# Patient Record
Sex: Female | Born: 1945
Health system: Southern US, Community
[De-identification: ages and names within clinical notes are randomized; demographics above are authoritative.]

## PROBLEM LIST (undated history)

## (undated) DIAGNOSIS — I82409 Acute embolism and thrombosis of unspecified deep veins of unspecified lower extremity: Secondary | ICD-10-CM

## (undated) DIAGNOSIS — I2510 Atherosclerotic heart disease of native coronary artery without angina pectoris: Secondary | ICD-10-CM

## (undated) DIAGNOSIS — F419 Anxiety disorder, unspecified: Secondary | ICD-10-CM

## (undated) DIAGNOSIS — C439 Malignant melanoma of skin, unspecified: Secondary | ICD-10-CM

## (undated) DIAGNOSIS — N809 Endometriosis, unspecified: Secondary | ICD-10-CM

## (undated) DIAGNOSIS — D219 Benign neoplasm of connective and other soft tissue, unspecified: Secondary | ICD-10-CM

## (undated) DIAGNOSIS — F32A Depression, unspecified: Secondary | ICD-10-CM

## (undated) DIAGNOSIS — I10 Essential (primary) hypertension: Secondary | ICD-10-CM

## (undated) DIAGNOSIS — F329 Major depressive disorder, single episode, unspecified: Secondary | ICD-10-CM

## (undated) DIAGNOSIS — E785 Hyperlipidemia, unspecified: Secondary | ICD-10-CM

## (undated) HISTORY — DX: Benign neoplasm of connective and other soft tissue, unspecified: D21.9

## (undated) HISTORY — DX: Anxiety disorder, unspecified: F41.9

## (undated) HISTORY — DX: Hyperlipidemia, unspecified: E78.5

## (undated) HISTORY — DX: Malignant melanoma of skin, unspecified: C43.9

## (undated) HISTORY — DX: Major depressive disorder, single episode, unspecified: F32.9

## (undated) HISTORY — DX: Endometriosis, unspecified: N80.9

## (undated) HISTORY — DX: Acute embolism and thrombosis of unspecified deep veins of unspecified lower extremity: I82.409

## (undated) HISTORY — DX: Depression, unspecified: F32.A

## (undated) HISTORY — DX: Essential (primary) hypertension: I10

## (undated) HISTORY — DX: Atherosclerotic heart disease of native coronary artery without angina pectoris: I25.10

---

## 1985-07-20 DIAGNOSIS — I82409 Acute embolism and thrombosis of unspecified deep veins of unspecified lower extremity: Secondary | ICD-10-CM

## 1985-07-20 HISTORY — PX: ABDOMINAL HYSTERECTOMY: SHX81

## 1985-07-20 HISTORY — DX: Acute embolism and thrombosis of unspecified deep veins of unspecified lower extremity: I82.409

## 1988-07-20 HISTORY — PX: CARDIAC CATHETERIZATION: SHX172

## 1998-06-22 ENCOUNTER — Encounter: Payer: Self-pay | Admitting: Emergency Medicine

## 1998-06-22 ENCOUNTER — Inpatient Hospital Stay (HOSPITAL_COMMUNITY): Admission: EM | Admit: 1998-06-22 | Discharge: 1998-06-25 | Payer: Self-pay | Admitting: Emergency Medicine

## 1998-09-18 ENCOUNTER — Other Ambulatory Visit: Admission: RE | Admit: 1998-09-18 | Discharge: 1998-09-18 | Payer: Self-pay | Admitting: Obstetrics and Gynecology

## 1998-10-04 ENCOUNTER — Encounter: Payer: Self-pay | Admitting: Cardiology

## 1998-10-04 ENCOUNTER — Ambulatory Visit (HOSPITAL_COMMUNITY): Admission: RE | Admit: 1998-10-04 | Discharge: 1998-10-04 | Payer: Self-pay | Admitting: Cardiology

## 2000-01-13 ENCOUNTER — Encounter: Payer: Self-pay | Admitting: Obstetrics and Gynecology

## 2000-01-13 ENCOUNTER — Encounter: Admission: RE | Admit: 2000-01-13 | Discharge: 2000-01-13 | Payer: Self-pay | Admitting: Obstetrics and Gynecology

## 2000-12-23 ENCOUNTER — Other Ambulatory Visit: Admission: RE | Admit: 2000-12-23 | Discharge: 2000-12-23 | Payer: Self-pay | Admitting: Obstetrics and Gynecology

## 2001-01-26 ENCOUNTER — Encounter: Payer: Self-pay | Admitting: Obstetrics and Gynecology

## 2001-01-26 ENCOUNTER — Ambulatory Visit (HOSPITAL_COMMUNITY): Admission: RE | Admit: 2001-01-26 | Discharge: 2001-01-26 | Payer: Self-pay | Admitting: Obstetrics and Gynecology

## 2001-05-18 ENCOUNTER — Encounter: Payer: Self-pay | Admitting: Orthopedic Surgery

## 2001-05-25 ENCOUNTER — Observation Stay (HOSPITAL_COMMUNITY): Admission: RE | Admit: 2001-05-25 | Discharge: 2001-05-26 | Payer: Self-pay | Admitting: Orthopedic Surgery

## 2002-03-27 HISTORY — PX: OTHER SURGICAL HISTORY: SHX169

## 2002-09-12 ENCOUNTER — Encounter: Admission: RE | Admit: 2002-09-12 | Discharge: 2002-09-12 | Payer: Self-pay | Admitting: Endocrinology

## 2002-09-12 ENCOUNTER — Encounter: Payer: Self-pay | Admitting: Endocrinology

## 2002-09-16 ENCOUNTER — Emergency Department (HOSPITAL_COMMUNITY): Admission: EM | Admit: 2002-09-16 | Discharge: 2002-09-16 | Payer: Self-pay | Admitting: Emergency Medicine

## 2002-09-16 ENCOUNTER — Encounter: Payer: Self-pay | Admitting: Emergency Medicine

## 2002-09-27 HISTORY — PX: OTHER SURGICAL HISTORY: SHX169

## 2002-10-19 ENCOUNTER — Encounter: Payer: Self-pay | Admitting: Neurology

## 2002-10-19 ENCOUNTER — Ambulatory Visit (HOSPITAL_COMMUNITY): Admission: RE | Admit: 2002-10-19 | Discharge: 2002-10-19 | Payer: Self-pay | Admitting: Neurology

## 2002-11-28 ENCOUNTER — Ambulatory Visit (HOSPITAL_COMMUNITY): Admission: RE | Admit: 2002-11-28 | Discharge: 2002-11-28 | Payer: Self-pay | Admitting: Obstetrics and Gynecology

## 2002-11-28 ENCOUNTER — Encounter: Payer: Self-pay | Admitting: Obstetrics and Gynecology

## 2003-07-21 HISTORY — PX: JOINT REPLACEMENT: SHX530

## 2003-10-30 ENCOUNTER — Encounter: Admission: RE | Admit: 2003-10-30 | Discharge: 2003-10-30 | Payer: Self-pay | Admitting: Endocrinology

## 2004-05-21 ENCOUNTER — Encounter: Admission: RE | Admit: 2004-05-21 | Discharge: 2004-05-21 | Payer: Self-pay | Admitting: Endocrinology

## 2004-09-18 ENCOUNTER — Encounter: Admission: RE | Admit: 2004-09-18 | Discharge: 2004-09-18 | Payer: Self-pay | Admitting: Endocrinology

## 2005-02-25 ENCOUNTER — Encounter: Admission: RE | Admit: 2005-02-25 | Discharge: 2005-02-25 | Payer: Self-pay | Admitting: Obstetrics and Gynecology

## 2006-07-01 ENCOUNTER — Encounter: Admission: RE | Admit: 2006-07-01 | Discharge: 2006-07-01 | Payer: Self-pay | Admitting: Obstetrics and Gynecology

## 2006-07-21 ENCOUNTER — Encounter: Admission: RE | Admit: 2006-07-21 | Discharge: 2006-07-21 | Payer: Self-pay | Admitting: Obstetrics and Gynecology

## 2006-08-30 ENCOUNTER — Encounter: Admission: RE | Admit: 2006-08-30 | Discharge: 2006-08-30 | Payer: Self-pay | Admitting: Endocrinology

## 2007-05-04 ENCOUNTER — Encounter: Admission: RE | Admit: 2007-05-04 | Discharge: 2007-05-04 | Payer: Self-pay | Admitting: Obstetrics and Gynecology

## 2007-08-26 ENCOUNTER — Encounter: Admission: RE | Admit: 2007-08-26 | Discharge: 2007-08-26 | Payer: Self-pay | Admitting: Obstetrics and Gynecology

## 2007-10-11 ENCOUNTER — Encounter: Admission: RE | Admit: 2007-10-11 | Discharge: 2007-10-11 | Payer: Self-pay | Admitting: Endocrinology

## 2008-07-30 ENCOUNTER — Other Ambulatory Visit: Admission: RE | Admit: 2008-07-30 | Discharge: 2008-07-30 | Payer: Self-pay | Admitting: Obstetrics & Gynecology

## 2009-10-08 ENCOUNTER — Encounter: Admission: RE | Admit: 2009-10-08 | Discharge: 2009-10-08 | Payer: Self-pay | Admitting: Obstetrics and Gynecology

## 2009-10-11 ENCOUNTER — Encounter: Admission: RE | Admit: 2009-10-11 | Discharge: 2009-10-11 | Payer: Self-pay | Admitting: Obstetrics and Gynecology

## 2010-08-10 ENCOUNTER — Encounter: Payer: Self-pay | Admitting: Obstetrics and Gynecology

## 2010-08-10 ENCOUNTER — Encounter: Payer: Self-pay | Admitting: Endocrinology

## 2010-12-05 NOTE — Op Note (Signed)
[facility]  Patient:    JAMIEKA, ROYLE Visit Number: [id number] MRN: [id number]          Service Type: SUR Location: 4W [id number] [id number] Attending Physician:  Skip Mayer Dictated by:   Georges Lynch Darrelyn Hillock, M.D. Proc. Date: 05/25/01 Admit Date:  05/25/2001 Discharge Date: 05/26/2001                             Operative Report  PREOPERATIVE DIAGNOSES: 1. Severe impingement syndrome of the left shoulder. 2. Chronic subdeltoid bursitis, left shoulder. 3. Rule out tear of the rotator cuff tendon, left shoulder.  POSTOPERATIVE DIAGNOSES: 1. Severe impingement syndrome of the left shoulder. 2. Chronic subdeltoid bursitis, left shoulder.  OPERATION:  SURGEON:  Georges Lynch. Darrelyn Hillock, M.D.  ASSISTANTDruscilla Brownie. Shela Nevin, P.A.  DESCRIPTION OF PROCEDURE:  Under general anesthesia, the patient first had a inner scalene block prior to general anesthesia.  A routine orthopedic prepping and draping of the left shoulder was carried out.  The patient had 1 g of IV Ancef.  Incision was made down over the anterior aspect of the acromion.  The incision was carried a little distally and at this time, I detached the deltoid tendon from the acromion by sharp dissection.  Following this, I noted literally that you could not get a Cobb elevator between the acromion and her shoulders because of the severe impingement.  At this time, I protected the rotator cuff with a Bennett retractor and did a partial acromionectomy and an acromioplasty.  I utilized the oscillating saw and the bur.  Once this was done we had a great deal of freedom to abduct her shoulder without any pressure on the rotator cuff.  I then excised the subdeltoid bursae to explore the cuff.  The cuff was intact.  It was a little frayed but not torn and no repair of the cuff was necessary.  I thoroughly irrigated out the area and reapproximated the deltoid tendon with #1 Vicryl.  The main part of  the wound was closed in layers in the usual fashion.  A sterile dressing was applied.  She was placed in the shoulder immobilizer. Dictated by:   Georges Lynch Darrelyn Hillock, M.D. Attending Physician:  Skip Mayer DD:  05/25/01 TD:  05/27/01 Job: [id number]

## 2011-01-12 ENCOUNTER — Other Ambulatory Visit: Payer: Self-pay | Admitting: Obstetrics and Gynecology

## 2011-01-12 DIAGNOSIS — N6459 Other signs and symptoms in breast: Secondary | ICD-10-CM

## 2011-01-19 ENCOUNTER — Ambulatory Visit
Admission: RE | Admit: 2011-01-19 | Discharge: 2011-01-19 | Disposition: A | Discharge status: Home / Self Care | Payer: BC Managed Care – PPO | Source: Ambulatory Visit | Attending: Obstetrics and Gynecology | Admitting: Obstetrics and Gynecology

## 2011-01-19 ENCOUNTER — Other Ambulatory Visit: Payer: Self-pay | Admitting: Obstetrics and Gynecology

## 2011-01-19 DIAGNOSIS — N6459 Other signs and symptoms in breast: Secondary | ICD-10-CM

## 2011-03-18 ENCOUNTER — Encounter (INDEPENDENT_AMBULATORY_CARE_PROVIDER_SITE_OTHER): Payer: Self-pay | Admitting: General Surgery

## 2011-03-18 ENCOUNTER — Ambulatory Visit (INDEPENDENT_AMBULATORY_CARE_PROVIDER_SITE_OTHER): Payer: BC Managed Care – PPO | Admitting: General Surgery

## 2011-03-18 VITALS — BP 110/70 | HR 48 | Temp 97.2°F | Ht 68.0 in | Wt 182.0 lb

## 2011-03-18 DIAGNOSIS — N63 Unspecified lump in unspecified breast: Secondary | ICD-10-CM | POA: Insufficient documentation

## 2011-03-18 NOTE — Progress Notes (Signed)
Chief Complaint  Patient presents with  . Other    3cm L breast mass    HPI Katie Collins is a 65 y.o. female.   HPI  She is referred by Dr. Tresa Res for an enlarging left breast mass in the OUQ.  She first noticed it at the end of May 2012.  She had a MMG and [place] on 01/19/11/ that did not demonstrate any abnormality in that area.  She saw Dr. Tresa Res after that and she felt that the mass was larger.  She has had a simple cyst in the left breast in the past.  No family h/o breast or ovarian cancer.  Menarche at age 68, first pregnancy at age 5.  Had a TAH/BSO in the 1980s.  Has been a HRT ever since.  Past Medical History  Diagnosis Date  . Hyperlipidemia   . Hypertension   . CAD (coronary artery disease)     Past Surgical History  Procedure Date  . Joint replacement 2005    Left rotator cuff replacement   . Cardiac catheterization 1990  . Abdominal hysterectomy 1987    Family History  Problem Relation Age of Onset  . Cancer Father     lung  . Cancer Maternal Aunt     colon  . Cancer Maternal Grandmother     colon    Social History History  Substance Use Topics  . Smoking status: Never Smoker   . Smokeless tobacco: Not on file  . Alcohol Use: Yes    No Known Allergies  Current Outpatient Prescriptions  Medication Sig Dispense Refill  . aspirin 325 MG EC tablet Take 325 mg by mouth daily.        Marland Kitchen atorvastatin (LIPITOR) 40 MG tablet Take 40 mg by mouth daily.        . clopidogrel (PLAVIX) 75 MG tablet Take 75 mg by mouth daily.        . Ergocalciferol (VITAMIN D2 PO) Take 1.25 mg by mouth daily.        Marland Kitchen escitalopram (LEXAPRO) 10 MG tablet Take 10 mg by mouth daily.        Marland Kitchen estradiol (VIVELLE-DOT) 0.075 MG/24HR Place 1 patch onto the skin 2 (two) times a week.        . ezetimibe (ZETIA) 10 MG tablet Take 10 mg by mouth daily.        . indomethacin (INDOCIN) 25 MG capsule Take 25 mg by mouth 2 (two) times daily at 10 am and 4 pm.        . metoprolol succinate  (TOPROL-XL) 25 MG 24 hr tablet Take 25 mg by mouth daily.        . valsartan (DIOVAN) 320 MG tablet Take 320 mg by mouth daily.          Review of Systems Review of Systems  Constitutional: Negative.   HENT: Negative.   Respiratory: Negative.   Cardiovascular: Negative.   Gastrointestinal: Negative.   Genitourinary: Negative.   Skin: Negative.   Endo/Heme/Allergies: Bruises/bleeds easily.    Blood pressure 110/70, pulse 48, temperature 97.2 F (36.2 C), height 5\' 8"  (1.727 m), weight 182 lb (82.555 kg).  Physical Exam Physical Exam  Constitutional: She appears well-developed and well-nourished. No distress.  Neck: Normal range of motion. Neck supple.       No cervical or supraclavicular adenopathy.  Respiratory:       Right breast- no mass, nipple discharge or suspicious skin change.  Left breast-3 cm  mass laterally beginning at areola and extending laterally; no redness; no skin changes; no nipple discharge.  Musculoskeletal: She exhibits no edema.       No axillary adenopathy.  Lymphadenopathy:    She has no cervical adenopathy.     Data Reviewed MMG, [place] reports.  Assessment    Palpable left breast mass not seen on imaging studies.    Plan    Arrange for large core needle biopsy to evaluate for malignancy.  If biopsy is benign, would recommend complete removal of mass.  Will have her stop her Plavix and Aspirin one week prior to procedure.       Json Koelzer J 03/18/2011, 12:15 PM

## 2011-03-18 NOTE — Patient Instructions (Signed)
Stop Aspirin and Plavix 10 days before needle biopsy.

## 2011-04-08 ENCOUNTER — Encounter (INDEPENDENT_AMBULATORY_CARE_PROVIDER_SITE_OTHER): Payer: Self-pay | Admitting: General Surgery

## 2011-04-08 ENCOUNTER — Ambulatory Visit (INDEPENDENT_AMBULATORY_CARE_PROVIDER_SITE_OTHER): Payer: BC Managed Care – PPO | Admitting: General Surgery

## 2011-04-08 ENCOUNTER — Other Ambulatory Visit (INDEPENDENT_AMBULATORY_CARE_PROVIDER_SITE_OTHER): Payer: Self-pay | Admitting: General Surgery

## 2011-04-08 VITALS — BP 132/86 | HR 64 | Temp 97.2°F | Resp 16 | Ht 68.0 in | Wt 183.8 lb

## 2011-04-08 DIAGNOSIS — N6320 Unspecified lump in the left breast, unspecified quadrant: Secondary | ICD-10-CM

## 2011-04-08 DIAGNOSIS — N63 Unspecified lump in unspecified breast: Secondary | ICD-10-CM

## 2011-04-08 NOTE — Progress Notes (Signed)
Katie Collins is here for her large core needle biopsy of a left breast mass.  She has been off her Plavix and aspirin for one week.  The central area of the left breast was sterilely prepped and draped. The mass at the 3:00 position was palpable. The area was anesthetized with 1% Xylocaine. A small incision was made in the skin overlying the mass. Using a 16-gauge core, 3 biopsies were performed. Adequate tissue was obtained. This was sent to pathology. Hemostasis was obtained with direct pressure. Benzoin and Steri-Strips were applied to the wound followed by bulky dressing.  Will call her with results when we receive them. If the lesion is benign I still think he needs to be removed with an outpatient procedure. She tolerated this procedure well. She was given postprocedure instructions.

## 2011-04-08 NOTE — Patient Instructions (Signed)
Ice pack to area today.  May remove bandage tomorrow.  May get area wet in 2 days.  Let steri-strips fall off by themselves.  Call for heavy bleeding.

## 2011-04-09 ENCOUNTER — Telehealth (INDEPENDENT_AMBULATORY_CARE_PROVIDER_SITE_OTHER): Payer: Self-pay | Admitting: General Surgery

## 2011-04-09 NOTE — Telephone Encounter (Signed)
I discussed the results of left breast large core needle biopsy with Mrs. Katie Collins.  This demonstrated benign fibrosis.  There is a small chance however that this could be a reaction to malignancy. The plan is to go and do an excision of this mass and she agreed with that. This is what we had discussed before as well. We will go ahead and get that scheduled.

## 2011-04-20 HISTORY — PX: BREAST SURGERY: SHX581

## 2011-04-22 ENCOUNTER — Other Ambulatory Visit (INDEPENDENT_AMBULATORY_CARE_PROVIDER_SITE_OTHER): Payer: Self-pay | Admitting: General Surgery

## 2011-04-22 ENCOUNTER — Encounter (HOSPITAL_COMMUNITY): Payer: BC Managed Care – PPO

## 2011-04-22 ENCOUNTER — Ambulatory Visit (HOSPITAL_COMMUNITY)
Admission: RE | Admit: 2011-04-22 | Discharge: 2011-04-22 | Disposition: A | Discharge status: Home / Self Care | Payer: BC Managed Care – PPO | Source: Ambulatory Visit | Attending: General Surgery | Admitting: General Surgery

## 2011-04-22 DIAGNOSIS — N63 Unspecified lump in unspecified breast: Secondary | ICD-10-CM | POA: Insufficient documentation

## 2011-04-22 DIAGNOSIS — N6310 Unspecified lump in the right breast, unspecified quadrant: Secondary | ICD-10-CM

## 2011-04-22 DIAGNOSIS — Z01810 Encounter for preprocedural cardiovascular examination: Secondary | ICD-10-CM | POA: Insufficient documentation

## 2011-04-22 DIAGNOSIS — Z01818 Encounter for other preprocedural examination: Secondary | ICD-10-CM | POA: Insufficient documentation

## 2011-04-22 DIAGNOSIS — Z01812 Encounter for preprocedural laboratory examination: Secondary | ICD-10-CM | POA: Insufficient documentation

## 2011-04-22 LAB — COMPREHENSIVE METABOLIC PANEL
ALT: 16 U/L (ref 0–35)
AST: 16 U/L (ref 0–37)
Albumin: 3.7 g/dL (ref 3.5–5.2)
Alkaline Phosphatase: 35 U/L — ABNORMAL LOW (ref 39–117)
BUN: 12 mg/dL (ref 6–23)
CO2: 31 mEq/L (ref 19–32)
Calcium: 9 mg/dL (ref 8.4–10.5)
Chloride: 102 mEq/L (ref 96–112)
Creatinine, Ser: 0.6 mg/dL (ref 0.50–1.10)
GFR calc Af Amer: >90 mL/min (ref >90)
GFR calc non Af Amer: >90 mL/min (ref >90)
Glucose, Bld: 86 mg/dL (ref 70–99)
Potassium: 4.1 mEq/L (ref 3.5–5.1)
Sodium: 138 mEq/L (ref 135–145)
Total Bilirubin: 0.6 mg/dL (ref 0.3–1.2)
Total Protein: 6.7 g/dL (ref 6.0–8.3)

## 2011-04-22 LAB — CBC
HCT: 40.6 % (ref 36.0–46.0)
Hemoglobin: 13.9 g/dL (ref 12.0–15.0)
MCH: 30.9 pg (ref 26.0–34.0)
MCHC: 34.2 g/dL (ref 30.0–36.0)
MCV: 90.2 fL (ref 78.0–100.0)
Platelets: 281 10*3/uL (ref 150–400)
RBC: 4.5 MIL/uL (ref 3.87–5.11)
RDW: 12.2 % (ref 11.5–15.5)
WBC: 4.1 10*3/uL (ref 4.0–10.5)

## 2011-04-22 LAB — DIFFERENTIAL
Basophils Absolute: 0.1 10*3/uL (ref 0.0–0.1)
Basophils Relative: 2 % — ABNORMAL HIGH (ref 0–1)
Eosinophils Absolute: 0.1 10*3/uL (ref 0.0–0.7)
Eosinophils Relative: 3 % (ref 0–5)
Lymphocytes Relative: 22 % (ref 12–46)
Lymphs Abs: 0.9 10*3/uL (ref 0.7–4.0)
Monocytes Absolute: 0.4 10*3/uL (ref 0.1–1.0)
Monocytes Relative: 10 % (ref 3–12)
Neutro Abs: 2.6 10*3/uL (ref 1.7–7.7)
Neutrophils Relative %: 64 % (ref 43–77)

## 2011-04-22 LAB — SURGICAL PCR SCREEN
MRSA, PCR: NEGATIVE
Staphylococcus aureus: NEGATIVE

## 2011-04-28 ENCOUNTER — Other Ambulatory Visit (INDEPENDENT_AMBULATORY_CARE_PROVIDER_SITE_OTHER): Payer: Self-pay | Admitting: General Surgery

## 2011-04-28 ENCOUNTER — Ambulatory Visit (HOSPITAL_COMMUNITY)
Admission: RE | Admit: 2011-04-28 | Discharge: 2011-04-28 | Disposition: A | Discharge status: Home / Self Care | Payer: BC Managed Care – PPO | Source: Ambulatory Visit | Attending: General Surgery | Admitting: General Surgery

## 2011-04-28 DIAGNOSIS — Z79899 Other long term (current) drug therapy: Secondary | ICD-10-CM | POA: Insufficient documentation

## 2011-04-28 DIAGNOSIS — D249 Benign neoplasm of unspecified breast: Secondary | ICD-10-CM

## 2011-04-28 DIAGNOSIS — I10 Essential (primary) hypertension: Secondary | ICD-10-CM | POA: Insufficient documentation

## 2011-04-28 DIAGNOSIS — N63 Unspecified lump in unspecified breast: Secondary | ICD-10-CM | POA: Insufficient documentation

## 2011-04-28 DIAGNOSIS — Z7982 Long term (current) use of aspirin: Secondary | ICD-10-CM | POA: Insufficient documentation

## 2011-04-28 DIAGNOSIS — E785 Hyperlipidemia, unspecified: Secondary | ICD-10-CM | POA: Insufficient documentation

## 2011-04-30 NOTE — Op Note (Signed)
  NAMEOLUWATAMILORE, STARNES              ACCOUNT NO.:  [id number]  MEDICAL RECORD NO.:  [id number]  LOCATION:  DAYL                         FACILITY:  [facility]  PHYSICIAN:  Adolph Pollack, M.D.DATE OF BIRTH:  Feb 03, 1946  DATE OF PROCEDURE:  04/28/2011 DATE OF DISCHARGE:  04/28/2011                              OPERATIVE REPORT   PREOPERATIVE DIAGNOSIS:  Persistent left breast mass.  POSTOPERATIVE DIAGNOSIS:  Persistent left breast mass.  PROCEDURE:  Excision of left breast mass.  SURGEON:  Adolph Pollack, M.D.  ANESTHESIA:  General/LMA plus Marcaine local.  INDICATION:  Mrs. Ramdass is a 65 year old female who was found to have an enlarging left breast mass in the upper outer quadrant.  Imaging studies did not definitively demonstrate this.  She subsequently underwent a large core needle biopsy which demonstrated breast fibrosis; however, the mass persisted and given the pathology which could be associated with underlying malignancy, it was recommended that she have excision, now she presents for that.  Procedure and risks and aftercare were discussed with her preoperatively.  TECHNIQUE:  She was seen in the holding area and left breast marked with my initials.  She was then brought to the operating room, placed supine on the operating table, and general anesthesia was administered.  The left breast was sterilely prepped and draped.  I made a circumareolar incision from approximately the 12 o'clock position down to the 5 o'clock position, through the skin and dermis. In the subcutaneous tissues, I was able to palpate the mass.  I raised the flaps in all directions around the mass elevated with Allis forceps. Using electrocautery, I then excised the mass and surrounding breast tissue.  I marked the anterior part of the mass with a single suture and the medial part with a double suture and sent it to Pathology.  Following this, I controlled bleeding with electrocautery.  I  then injected 0.5% plain Marcaine solution for local anesthetic effect into the wound.  Second inspection demonstrated hemostasis was adequate.  Subcutaneous tissue was then approximated using 3-0 Vicryl suture.  The skin was closed with a running 4-0 Monocryl subcuticular stitch.  Steri- Strips and sterile dressings were applied.  A breast binder was applied.  She tolerated the procedure without any apparent complications and was taken to recovery room in satisfactory condition.     Adolph Pollack, M.D.     Kari Baars  D:  04/28/2011  T:  04/28/2011  Job:  [id number]  cc:   Aram Beecham P. Romine, M.D. Fax: [phone]  Brooke Bonito, M.D. Fax: [phone]  Electronically Signed by Avel Peace M.D. on 04/30/2011 12:10:06 PM

## 2011-05-04 ENCOUNTER — Telehealth (INDEPENDENT_AMBULATORY_CARE_PROVIDER_SITE_OTHER): Payer: Self-pay

## 2011-05-04 NOTE — Telephone Encounter (Signed)
Patient called wanting pathology report results. I read the results to her and she understood them. Will follow up with [place] at next appointment.

## 2011-05-14 ENCOUNTER — Encounter (INDEPENDENT_AMBULATORY_CARE_PROVIDER_SITE_OTHER): Payer: Self-pay

## 2011-05-14 ENCOUNTER — Ambulatory Visit (INDEPENDENT_AMBULATORY_CARE_PROVIDER_SITE_OTHER): Payer: BC Managed Care – PPO | Admitting: General Surgery

## 2011-05-14 ENCOUNTER — Encounter (INDEPENDENT_AMBULATORY_CARE_PROVIDER_SITE_OTHER): Payer: Self-pay | Admitting: General Surgery

## 2011-05-14 VITALS — BP 128/82 | HR 68 | Temp 97.4°F | Resp 16 | Ht 68.0 in | Wt 182.8 lb

## 2011-05-14 DIAGNOSIS — N6459 Other signs and symptoms in breast: Secondary | ICD-10-CM

## 2011-05-14 DIAGNOSIS — N6019 Diffuse cystic mastopathy of unspecified breast: Secondary | ICD-10-CM | POA: Insufficient documentation

## 2011-05-14 NOTE — Progress Notes (Signed)
Katie Collins is here for a postop visit following excision of a left breast mass on 04/28/11.  She is doing well with no pain or wound problems.    Exam:  Left breast incision is clean and intact with some surrounding ecchymosis.  Pathology: Benign fibrocystic change, no malignancy.  Assess:  Left breast wound healing well and pathology is benign.  Plan:  Return visit prn.

## 2011-07-21 HISTORY — PX: BREAST EXCISIONAL BIOPSY: SUR124

## 2012-04-05 DIAGNOSIS — S82009A Unspecified fracture of unspecified patella, initial encounter for closed fracture: Secondary | ICD-10-CM | POA: Diagnosis not present

## 2012-04-12 DIAGNOSIS — S82009A Unspecified fracture of unspecified patella, initial encounter for closed fracture: Secondary | ICD-10-CM | POA: Diagnosis not present

## 2012-04-19 HISTORY — PX: PATELLA FRACTURE SURGERY: SHX735

## 2012-04-21 ENCOUNTER — Other Ambulatory Visit: Payer: Self-pay | Admitting: Orthopedic Surgery

## 2012-04-21 ENCOUNTER — Ambulatory Visit (HOSPITAL_COMMUNITY)
Admission: RE | Admit: 2012-04-21 | Discharge: 2012-04-21 | Disposition: A | Discharge status: Home / Self Care | Payer: Medicare Other | Source: Ambulatory Visit | Attending: Orthopedic Surgery | Admitting: Orthopedic Surgery

## 2012-04-21 DIAGNOSIS — M7989 Other specified soft tissue disorders: Secondary | ICD-10-CM

## 2012-04-21 DIAGNOSIS — M79606 Pain in leg, unspecified: Secondary | ICD-10-CM

## 2012-04-21 DIAGNOSIS — I82891 Chronic embolism and thrombosis of other specified veins: Secondary | ICD-10-CM | POA: Insufficient documentation

## 2012-04-21 DIAGNOSIS — M79609 Pain in unspecified limb: Secondary | ICD-10-CM | POA: Diagnosis not present

## 2012-04-21 DIAGNOSIS — I82890 Acute embolism and thrombosis of other specified veins: Secondary | ICD-10-CM | POA: Insufficient documentation

## 2012-04-21 DIAGNOSIS — S82009A Unspecified fracture of unspecified patella, initial encounter for closed fracture: Secondary | ICD-10-CM | POA: Diagnosis not present

## 2012-04-21 HISTORY — PX: OTHER SURGICAL HISTORY: SHX169

## 2012-04-21 NOTE — Progress Notes (Signed)
VASCULAR LAB PRELIMINARY  PRELIMINARY  PRELIMINARY  PRELIMINARY  Left lower extremity venous duplex completed.    Preliminary report:  Left:  Acute DVT noted in the soleal vein.  Chronic DVT noted in the CFV.  No evidence of superficial thrombosis.  No Baker's cyst.  Ethelle Ola, RVT 04/21/2012, 5:14 PM

## 2012-04-22 DIAGNOSIS — E789 Disorder of lipoprotein metabolism, unspecified: Secondary | ICD-10-CM | POA: Diagnosis not present

## 2012-04-22 DIAGNOSIS — M79609 Pain in unspecified limb: Secondary | ICD-10-CM | POA: Diagnosis not present

## 2012-04-22 DIAGNOSIS — I809 Phlebitis and thrombophlebitis of unspecified site: Secondary | ICD-10-CM | POA: Diagnosis not present

## 2012-04-22 DIAGNOSIS — I10 Essential (primary) hypertension: Secondary | ICD-10-CM | POA: Diagnosis not present

## 2012-04-25 DIAGNOSIS — I809 Phlebitis and thrombophlebitis of unspecified site: Secondary | ICD-10-CM | POA: Diagnosis not present

## 2012-04-27 DIAGNOSIS — Z23 Encounter for immunization: Secondary | ICD-10-CM | POA: Diagnosis not present

## 2012-04-27 DIAGNOSIS — I2510 Atherosclerotic heart disease of native coronary artery without angina pectoris: Secondary | ICD-10-CM | POA: Diagnosis not present

## 2012-04-27 DIAGNOSIS — I809 Phlebitis and thrombophlebitis of unspecified site: Secondary | ICD-10-CM | POA: Diagnosis not present

## 2012-05-10 DIAGNOSIS — S82009A Unspecified fracture of unspecified patella, initial encounter for closed fracture: Secondary | ICD-10-CM | POA: Diagnosis not present

## 2012-05-11 DIAGNOSIS — I809 Phlebitis and thrombophlebitis of unspecified site: Secondary | ICD-10-CM | POA: Diagnosis not present

## 2012-05-11 DIAGNOSIS — I2510 Atherosclerotic heart disease of native coronary artery without angina pectoris: Secondary | ICD-10-CM | POA: Diagnosis not present

## 2012-05-11 DIAGNOSIS — I10 Essential (primary) hypertension: Secondary | ICD-10-CM | POA: Diagnosis not present

## 2012-05-11 DIAGNOSIS — E789 Disorder of lipoprotein metabolism, unspecified: Secondary | ICD-10-CM | POA: Diagnosis not present

## 2012-05-12 ENCOUNTER — Other Ambulatory Visit: Payer: Self-pay | Admitting: Obstetrics and Gynecology

## 2012-05-12 DIAGNOSIS — Z1231 Encounter for screening mammogram for malignant neoplasm of breast: Secondary | ICD-10-CM

## 2012-05-24 DIAGNOSIS — Z90710 Acquired absence of both cervix and uterus: Secondary | ICD-10-CM | POA: Diagnosis not present

## 2012-05-24 DIAGNOSIS — Z124 Encounter for screening for malignant neoplasm of cervix: Secondary | ICD-10-CM | POA: Diagnosis not present

## 2012-05-24 DIAGNOSIS — Z01419 Encounter for gynecological examination (general) (routine) without abnormal findings: Secondary | ICD-10-CM | POA: Diagnosis not present

## 2012-05-24 DIAGNOSIS — E559 Vitamin D deficiency, unspecified: Secondary | ICD-10-CM | POA: Diagnosis not present

## 2012-05-24 DIAGNOSIS — Z1272 Encounter for screening for malignant neoplasm of vagina: Secondary | ICD-10-CM | POA: Diagnosis not present

## 2012-05-24 DIAGNOSIS — Z0000 Encounter for general adult medical examination without abnormal findings: Secondary | ICD-10-CM | POA: Diagnosis not present

## 2012-06-03 ENCOUNTER — Ambulatory Visit
Admission: RE | Admit: 2012-06-03 | Discharge: 2012-06-03 | Disposition: A | Discharge status: Home / Self Care | Payer: Medicare Other | Source: Ambulatory Visit | Attending: Obstetrics and Gynecology | Admitting: Obstetrics and Gynecology

## 2012-06-03 DIAGNOSIS — Z1231 Encounter for screening mammogram for malignant neoplasm of breast: Secondary | ICD-10-CM

## 2012-06-06 DIAGNOSIS — E782 Mixed hyperlipidemia: Secondary | ICD-10-CM | POA: Diagnosis not present

## 2012-06-06 DIAGNOSIS — I10 Essential (primary) hypertension: Secondary | ICD-10-CM | POA: Diagnosis not present

## 2012-06-06 DIAGNOSIS — I82409 Acute embolism and thrombosis of unspecified deep veins of unspecified lower extremity: Secondary | ICD-10-CM | POA: Diagnosis not present

## 2012-06-06 DIAGNOSIS — I2510 Atherosclerotic heart disease of native coronary artery without angina pectoris: Secondary | ICD-10-CM | POA: Diagnosis not present

## 2012-08-04 DIAGNOSIS — I10 Essential (primary) hypertension: Secondary | ICD-10-CM | POA: Diagnosis not present

## 2012-08-23 DIAGNOSIS — I809 Phlebitis and thrombophlebitis of unspecified site: Secondary | ICD-10-CM | POA: Diagnosis not present

## 2012-08-23 DIAGNOSIS — E789 Disorder of lipoprotein metabolism, unspecified: Secondary | ICD-10-CM | POA: Diagnosis not present

## 2012-08-23 DIAGNOSIS — M79609 Pain in unspecified limb: Secondary | ICD-10-CM | POA: Diagnosis not present

## 2012-08-23 DIAGNOSIS — I10 Essential (primary) hypertension: Secondary | ICD-10-CM | POA: Diagnosis not present

## 2012-08-30 DIAGNOSIS — S82009A Unspecified fracture of unspecified patella, initial encounter for closed fracture: Secondary | ICD-10-CM | POA: Diagnosis not present

## 2012-09-16 DIAGNOSIS — M1710 Unilateral primary osteoarthritis, unspecified knee: Secondary | ICD-10-CM | POA: Diagnosis not present

## 2012-09-16 DIAGNOSIS — IMO0002 Reserved for concepts with insufficient information to code with codable children: Secondary | ICD-10-CM | POA: Diagnosis not present

## 2012-12-01 ENCOUNTER — Other Ambulatory Visit: Payer: Self-pay | Admitting: Internal Medicine

## 2012-12-01 DIAGNOSIS — R0602 Shortness of breath: Secondary | ICD-10-CM | POA: Diagnosis not present

## 2012-12-01 DIAGNOSIS — Z79899 Other long term (current) drug therapy: Secondary | ICD-10-CM | POA: Diagnosis not present

## 2012-12-01 LAB — BASIC METABOLIC PANEL
BUN: 11 mg/dL (ref 6–23)
CO2: 27 mEq/L (ref 19–32)
Calcium: 9.4 mg/dL (ref 8.4–10.5)
Chloride: 104 mEq/L (ref 96–112)
Creat: 0.83 mg/dL (ref 0.50–1.10)
Glucose, Bld: 132 mg/dL — ABNORMAL HIGH (ref 70–99)
Potassium: 3.8 mEq/L (ref 3.5–5.3)
Sodium: 140 mEq/L (ref 135–145)

## 2012-12-01 LAB — D-DIMER, QUANTITATIVE: D-Dimer, Quant: 0.27 ug/mL-FEU (ref 0.00–0.48)

## 2012-12-06 ENCOUNTER — Encounter: Payer: Self-pay | Admitting: *Deleted

## 2012-12-07 ENCOUNTER — Encounter: Payer: Self-pay | Admitting: Internal Medicine

## 2012-12-08 ENCOUNTER — Encounter: Payer: Self-pay | Admitting: Internal Medicine

## 2012-12-08 ENCOUNTER — Ambulatory Visit (INDEPENDENT_AMBULATORY_CARE_PROVIDER_SITE_OTHER): Payer: Medicare Other | Admitting: Internal Medicine

## 2012-12-08 DIAGNOSIS — E785 Hyperlipidemia, unspecified: Secondary | ICD-10-CM

## 2012-12-08 DIAGNOSIS — F32A Depression, unspecified: Secondary | ICD-10-CM | POA: Insufficient documentation

## 2012-12-08 DIAGNOSIS — T148XXA Other injury of unspecified body region, initial encounter: Secondary | ICD-10-CM | POA: Insufficient documentation

## 2012-12-08 DIAGNOSIS — I10 Essential (primary) hypertension: Secondary | ICD-10-CM

## 2012-12-08 DIAGNOSIS — F3289 Other specified depressive episodes: Secondary | ICD-10-CM | POA: Diagnosis not present

## 2012-12-08 DIAGNOSIS — F329 Major depressive disorder, single episode, unspecified: Secondary | ICD-10-CM

## 2012-12-08 DIAGNOSIS — I82409 Acute embolism and thrombosis of unspecified deep veins of unspecified lower extremity: Secondary | ICD-10-CM | POA: Diagnosis not present

## 2012-12-08 DIAGNOSIS — E782 Mixed hyperlipidemia: Secondary | ICD-10-CM | POA: Insufficient documentation

## 2012-12-08 NOTE — Patient Instructions (Addendum)
Your physician wants you to follow-up in: 12 months. You will receive a reminder letter in the mail two months in advance. If you don't receive a letter, please call our office to schedule the follow-up appointment.  Your physician has recommended you make the following change in your medication stop  Xarelto follow Dr. Rennis Golden instruction. Restart Aspirin 325 mg tablet a day   Call when you have the information about your mother's condition if you need to schedule for test

## 2012-12-08 NOTE — Progress Notes (Signed)
OFFICE NOTE  Chief Complaint:  Followup DVT  Primary Care Physician: Michiel Sites, MD  HPI:  Katie Collins is a 67 year old female formerly followed by Dr. Aleen Campi for coronary disease. She had percutaneous intervention without a stent in 1999 and had a cath in 2003 which showed no significant stenoses. She has been maintained on aspirin and Plavix up until recently when she had a fall and developed a left leg fracture, I believe it was her patella. She seemed to recover from that with splinting, however, then developed swelling and redness of her left lower extremity and was found to have a soleal vein DVT, and this was at the end of September; she was started on Xarelto October 4th and her aspirin and Plavix were held. She is sent to me today for anticoagulation management given her history of coronary disease, also to reestablish with cardiology. Overall since she started on the Xarelto she has been doing much better. There is decreased swelling, although it is persistent in the left lower extremity. Her pain has improved, and she has had no adverse bleeding issues with the Xarelto other than easy bruising. Interestingly she reported a history of clot secondary to hysterectomy in 1987 and therefore may be hypercoagulable. I am not clear as to whether she has had a workup for hypercoagulability or not in the past. She returns for a six-month visit today he reports doing well. She is intermittently been wearing a stocking to help prevent post-thrombotic syndrome and has noted a marked decrease in swelling in her left leg. Her only other concern today is that her mother was diagnosed with an aortic aneurysm, however she is in her 32. Her cardiovascular specialist recommended that the entire family be screened for aortopathy's, suggesting that there is a possible genetic component to this.  Finally over the past 2 weeks she has noted some increasing night sweats, difficulty sleeping, and  increased fatigue. She reports that she just "does not feel right".  PMHx:  Past Medical History  Diagnosis Date  . Hyperlipidemia   . Hypertension   . CAD (coronary artery disease)   . Deep vein thrombosis (DVT) 1987    REMOTE H/O; PT ALSO HAD 03/2012 A SOLEAL VEIN DVT LEFT LEG DUE TO FALL; WAS THEN STARTED ON XARELTO 04/22/12  . Depression     Past Surgical History  Procedure Laterality Date  . Joint replacement  2005    Left rotator cuff replacement   . Abdominal hysterectomy  1987  . Breast surgery  04/2011    mass in left breast  . Myocardial perfusion  03/27/02    NEGATIVE ADEQUATE BRUCE PROTOCOL W/DECONDITIONED EXERCISE RESPONSE, NORMAL STATIC AND DYNAMIC MYOCARDIAL PERFUSION IMAGES W/EF QGS 60%; LOW RSIK STUDY  . Cardiac catheterization  1990    PT HAD ANOTHER CATH 04/06/02; NORMAL-APPREARING CORONARY ARTERIES; NORMAL LV FUNCTION; NORMAL MITRAL AND AORTIC VALVES; NORMAL ABDOMINAL AORTA  AND RENAL ARTERIES  . Lower extremity venous duplex  04/21/12    FINDINGS CONSISTENT W/ACUTE DVT INVOLVING THE LEFT SOLEAL VEIN; ALSO LEFT COMMON FEMORAL VEIN  . Carotid doppler  09/27/02    NORMAL COMMON AND INTERNAL CAROTID ARTERIES BILATERALLY W/NORMAL FLOW SEEN IN BOTH INTERNAL CAROTID ARTERIE. ; ANTEGRADE FLOW IN BOTH VERTEBRAL ARTERIES    FAMHx:  Family History  Problem Relation Age of Onset  . Cancer Father     lung; PASSED AWAY 1994-08-03  . Hyperlipidemia Father   . Hypertension Father   . Heart disease Father 3  .  Cancer Maternal Aunt     colon  . Cancer Maternal Grandmother     colon    SOCHx:   reports that she has never smoked. She has never used smokeless tobacco. She reports that  drinks alcohol. She reports that she does not use illicit drugs.  ALLERGIES:  No Known Allergies  ROS: A comprehensive review of systems was negative except for: Constitutional: positive for fatigue and night sweats Cardiovascular: positive for lower extremity edema  HOME MEDS: Current  Outpatient Prescriptions  Medication Sig Dispense Refill  . aspirin 325 MG EC tablet Take 325 mg by mouth daily.        Marland Kitchen atorvastatin (LIPITOR) 40 MG tablet Take 40 mg by mouth daily.        . clopidogrel (PLAVIX) 75 MG tablet Take 75 mg by mouth daily.        . Ergocalciferol (VITAMIN D2 PO) Take 1.25 mg by mouth daily.        Marland Kitchen escitalopram (LEXAPRO) 10 MG tablet Take 10 mg by mouth daily.        Marland Kitchen ezetimibe (ZETIA) 10 MG tablet Take 10 mg by mouth daily.        . metoprolol succinate (TOPROL-XL) 25 MG 24 hr tablet Take 25 mg by mouth daily.        . valsartan (DIOVAN) 320 MG tablet Take 320 mg by mouth daily.        Carlena Hurl 20 MG TABS Take a tablet daily       No current facility-administered medications for this visit.    LABS/IMAGING: No results found for this or any previous visit (from the past 48 hour(s)). No results found.  VITALS: BP 160/100  Pulse 73  Ht 5\' 8"  (1.727 m)  Wt 190 lb 12.8 oz (86.546 kg)  BMI 29.02 kg/m2  EXAM: General appearance: alert and no distress Neck: no adenopathy, no carotid bruit, no JVD, supple, symmetrical, trachea midline and thyroid not enlarged, symmetric, no tenderness/mass/nodules Lungs: clear to auscultation bilaterally Heart: regular rate and rhythm, S1, S2 normal, no murmur, click, rub or gallop Abdomen: soft, non-tender; bowel sounds normal; no masses,  no organomegaly Extremities: extremities normal, atraumatic, no cyanosis or edema Pulses: 2+ and symmetric Skin: Warm, moist Neurologic: Grossly normal  EKG: Normal sinus rhythm with marked sinus arrhythmia at 73  ASSESSMENT: 1. Left soleal deep vein thrombosis-resolved 2. Hypertension-uncontrolled 3. Dyslipidemia 4. History of coronary artery disease status post plain old balloon angioplasty in 1999 5. Depression 6. Fatigue and night sweats, without weight loss  PLAN: 1.   Ms. Forcier seems to be improving from her left soleal DVT. She has been on [place] for greater than  6 months and has no problems with pain or swelling of the left lower leg. Her repeat D. dimer is nearly negative at 0.27, this is within the range of normal for the test. At this point I would recommend discontinuing Xarelto by tapering to 10 mg daily for 3 days with an overlap of full dose aspirin. Thereafter she should continue aspirin 325 mg daily. I do have suspicion for possible underlying clotting disorder and she had a previous clot associated with hysterectomy in 1987.  She does not desire any testing at this time however it may be helpful to obtain some testing in the future.  I not sure what to make about her night sweats, they could be perimenopausal. There is no association with weight loss decreased appetite or change in bowel habits.  Her blood pressure is elevated today and a recheck was still slightly high. I've asked her to follow her blood pressures at home we may need to increase her metoprolol. Her EKG does show a sinus arrhythmia which is new. Finally she is concerned about possible familial aortopathy, given her mother's aortic dilatation. She is to obtain information from her mother's cardiovascular specialist, which may be helpful at obtaining scanning CT aortogram to rule out any genetic aortopathy in her. Plan 4 followup annually or sooner as necessary.  Chrystie Nose, MD, [facility] Attending Cardiologist [facility]  Amarien Carne C 12/08/2012, 10:53 AM

## 2012-12-15 DIAGNOSIS — E789 Disorder of lipoprotein metabolism, unspecified: Secondary | ICD-10-CM | POA: Diagnosis not present

## 2012-12-15 DIAGNOSIS — R61 Generalized hyperhidrosis: Secondary | ICD-10-CM | POA: Diagnosis not present

## 2012-12-15 DIAGNOSIS — I10 Essential (primary) hypertension: Secondary | ICD-10-CM | POA: Diagnosis not present

## 2012-12-15 DIAGNOSIS — R0989 Other specified symptoms and signs involving the circulatory and respiratory systems: Secondary | ICD-10-CM | POA: Diagnosis not present

## 2012-12-21 DIAGNOSIS — H5210 Myopia, unspecified eye: Secondary | ICD-10-CM | POA: Diagnosis not present

## 2012-12-21 DIAGNOSIS — H524 Presbyopia: Secondary | ICD-10-CM | POA: Diagnosis not present

## 2012-12-21 DIAGNOSIS — H52229 Regular astigmatism, unspecified eye: Secondary | ICD-10-CM | POA: Diagnosis not present

## 2012-12-21 DIAGNOSIS — H35039 Hypertensive retinopathy, unspecified eye: Secondary | ICD-10-CM | POA: Diagnosis not present

## 2013-01-09 DIAGNOSIS — Z79899 Other long term (current) drug therapy: Secondary | ICD-10-CM | POA: Diagnosis not present

## 2013-01-09 DIAGNOSIS — I2510 Atherosclerotic heart disease of native coronary artery without angina pectoris: Secondary | ICD-10-CM | POA: Diagnosis not present

## 2013-01-09 DIAGNOSIS — I10 Essential (primary) hypertension: Secondary | ICD-10-CM | POA: Diagnosis not present

## 2013-01-09 DIAGNOSIS — I803 Phlebitis and thrombophlebitis of lower extremities, unspecified: Secondary | ICD-10-CM | POA: Diagnosis not present

## 2013-01-09 DIAGNOSIS — I80299 Phlebitis and thrombophlebitis of other deep vessels of unspecified lower extremity: Secondary | ICD-10-CM | POA: Diagnosis not present

## 2013-01-09 DIAGNOSIS — E789 Disorder of lipoprotein metabolism, unspecified: Secondary | ICD-10-CM | POA: Diagnosis not present

## 2013-01-09 DIAGNOSIS — I82409 Acute embolism and thrombosis of unspecified deep veins of unspecified lower extremity: Secondary | ICD-10-CM | POA: Diagnosis not present

## 2013-01-16 DIAGNOSIS — I10 Essential (primary) hypertension: Secondary | ICD-10-CM | POA: Diagnosis not present

## 2013-01-16 DIAGNOSIS — I82409 Acute embolism and thrombosis of unspecified deep veins of unspecified lower extremity: Secondary | ICD-10-CM | POA: Diagnosis not present

## 2013-01-16 DIAGNOSIS — E559 Vitamin D deficiency, unspecified: Secondary | ICD-10-CM | POA: Diagnosis not present

## 2013-01-16 DIAGNOSIS — E2839 Other primary ovarian failure: Secondary | ICD-10-CM | POA: Diagnosis not present

## 2013-01-16 DIAGNOSIS — E789 Disorder of lipoprotein metabolism, unspecified: Secondary | ICD-10-CM | POA: Diagnosis not present

## 2013-02-09 ENCOUNTER — Other Ambulatory Visit: Payer: Self-pay | Admitting: Endocrinology

## 2013-02-09 DIAGNOSIS — Z8249 Family history of ischemic heart disease and other diseases of the circulatory system: Secondary | ICD-10-CM

## 2013-02-09 DIAGNOSIS — I10 Essential (primary) hypertension: Secondary | ICD-10-CM

## 2013-02-09 DIAGNOSIS — E789 Disorder of lipoprotein metabolism, unspecified: Secondary | ICD-10-CM

## 2013-02-10 ENCOUNTER — Other Ambulatory Visit: Payer: Self-pay | Admitting: Endocrinology

## 2013-02-10 ENCOUNTER — Other Ambulatory Visit: Payer: Self-pay | Admitting: Family Medicine

## 2013-02-10 DIAGNOSIS — I10 Essential (primary) hypertension: Secondary | ICD-10-CM

## 2013-02-10 DIAGNOSIS — E789 Disorder of lipoprotein metabolism, unspecified: Secondary | ICD-10-CM

## 2013-02-10 DIAGNOSIS — I2510 Atherosclerotic heart disease of native coronary artery without angina pectoris: Secondary | ICD-10-CM

## 2013-02-23 ENCOUNTER — Other Ambulatory Visit: Payer: Self-pay | Admitting: Endocrinology

## 2013-02-23 DIAGNOSIS — I10 Essential (primary) hypertension: Secondary | ICD-10-CM

## 2013-02-23 DIAGNOSIS — R109 Unspecified abdominal pain: Secondary | ICD-10-CM

## 2013-02-23 DIAGNOSIS — R079 Chest pain, unspecified: Secondary | ICD-10-CM

## 2013-02-23 DIAGNOSIS — E789 Disorder of lipoprotein metabolism, unspecified: Secondary | ICD-10-CM

## 2013-02-23 DIAGNOSIS — Z8249 Family history of ischemic heart disease and other diseases of the circulatory system: Secondary | ICD-10-CM

## 2013-03-07 ENCOUNTER — Ambulatory Visit
Admission: RE | Admit: 2013-03-07 | Discharge: 2013-03-07 | Disposition: A | Discharge status: Home / Self Care | Payer: Medicare Other | Source: Ambulatory Visit | Attending: Endocrinology | Admitting: Endocrinology

## 2013-03-07 DIAGNOSIS — R911 Solitary pulmonary nodule: Secondary | ICD-10-CM | POA: Diagnosis not present

## 2013-03-07 DIAGNOSIS — I10 Essential (primary) hypertension: Secondary | ICD-10-CM

## 2013-03-07 DIAGNOSIS — R109 Unspecified abdominal pain: Secondary | ICD-10-CM

## 2013-03-07 DIAGNOSIS — Z8249 Family history of ischemic heart disease and other diseases of the circulatory system: Secondary | ICD-10-CM

## 2013-03-07 DIAGNOSIS — Z01812 Encounter for preprocedural laboratory examination: Secondary | ICD-10-CM | POA: Diagnosis not present

## 2013-03-07 DIAGNOSIS — E789 Disorder of lipoprotein metabolism, unspecified: Secondary | ICD-10-CM

## 2013-03-07 DIAGNOSIS — R079 Chest pain, unspecified: Secondary | ICD-10-CM

## 2013-03-07 MED ORDER — IOHEXOL 300 MG/ML  SOLN
100.0000 mL | Freq: Once | INTRAMUSCULAR | Status: AC | PRN
  Administered 2013-03-07: 100 mL via INTRAVENOUS

## 2013-03-15 DIAGNOSIS — R911 Solitary pulmonary nodule: Secondary | ICD-10-CM | POA: Diagnosis not present

## 2013-03-15 DIAGNOSIS — Z79899 Other long term (current) drug therapy: Secondary | ICD-10-CM | POA: Diagnosis not present

## 2013-03-15 DIAGNOSIS — I10 Essential (primary) hypertension: Secondary | ICD-10-CM | POA: Diagnosis not present

## 2013-03-15 DIAGNOSIS — I2510 Atherosclerotic heart disease of native coronary artery without angina pectoris: Secondary | ICD-10-CM | POA: Diagnosis not present

## 2013-05-09 DIAGNOSIS — E789 Disorder of lipoprotein metabolism, unspecified: Secondary | ICD-10-CM | POA: Diagnosis not present

## 2013-05-16 DIAGNOSIS — E789 Disorder of lipoprotein metabolism, unspecified: Secondary | ICD-10-CM | POA: Diagnosis not present

## 2013-05-16 DIAGNOSIS — I82409 Acute embolism and thrombosis of unspecified deep veins of unspecified lower extremity: Secondary | ICD-10-CM | POA: Diagnosis not present

## 2013-05-16 DIAGNOSIS — Z23 Encounter for immunization: Secondary | ICD-10-CM | POA: Diagnosis not present

## 2013-05-16 DIAGNOSIS — I10 Essential (primary) hypertension: Secondary | ICD-10-CM | POA: Diagnosis not present

## 2013-05-22 ENCOUNTER — Other Ambulatory Visit: Payer: Self-pay | Admitting: Endocrinology

## 2013-05-22 DIAGNOSIS — R911 Solitary pulmonary nodule: Secondary | ICD-10-CM

## 2013-05-24 DIAGNOSIS — Z23 Encounter for immunization: Secondary | ICD-10-CM | POA: Diagnosis not present

## 2013-06-26 ENCOUNTER — Other Ambulatory Visit: Payer: Self-pay | Admitting: Certified Nurse Midwife

## 2013-06-26 NOTE — Telephone Encounter (Signed)
Left Message To Call Back Re: Patient needs AEX  Last AEX 05/24/12 rx was sent x 1 year

## 2013-06-27 NOTE — Telephone Encounter (Signed)
Patient aware refill has been sent 

## 2013-06-27 NOTE — Telephone Encounter (Signed)
Scheduled patient for AEX 07/18/13 @ 8:30  Okay to refill Lexapro 10 mg #30/0 refills?  Please advise.

## 2013-07-11 ENCOUNTER — Encounter: Payer: Self-pay | Admitting: Certified Nurse Midwife

## 2013-07-11 ENCOUNTER — Ambulatory Visit
Admission: RE | Admit: 2013-07-11 | Discharge: 2013-07-11 | Disposition: A | Discharge status: Home / Self Care | Payer: Medicare Other | Source: Ambulatory Visit | Attending: Endocrinology | Admitting: Endocrinology

## 2013-07-11 DIAGNOSIS — R911 Solitary pulmonary nodule: Secondary | ICD-10-CM | POA: Diagnosis not present

## 2013-07-11 MED ORDER — IOHEXOL 300 MG/ML  SOLN
75.0000 mL | Freq: Once | INTRAMUSCULAR | Status: AC | PRN
  Administered 2013-07-11: 75 mL via INTRAVENOUS

## 2013-07-18 ENCOUNTER — Ambulatory Visit (INDEPENDENT_AMBULATORY_CARE_PROVIDER_SITE_OTHER): Payer: Medicare Other | Admitting: Certified Nurse Midwife

## 2013-07-18 ENCOUNTER — Encounter: Payer: Self-pay | Admitting: Certified Nurse Midwife

## 2013-07-18 VITALS — BP 124/68 | HR 68 | Resp 16 | Ht 68.25 in | Wt 187.0 lb

## 2013-07-18 DIAGNOSIS — Z01419 Encounter for gynecological examination (general) (routine) without abnormal findings: Secondary | ICD-10-CM | POA: Diagnosis not present

## 2013-07-18 NOTE — Progress Notes (Signed)
67 y.o. G48P2002 Married Caucasian Fe here for annual exam. Menopausal no HRT. Patient denies vaginal bleeding or vaginal dryness. Sees PCP for aex, labs and medication management. Sees cardiology yearly and recent screen for aneurysm due her mother's recent surgery for one. Patient screened negative for. Denies any problems today. Recently retired!! Plans to travel in her "RV".  Patient's last menstrual period was 07/20/1985.          Sexually active: yes  The current method of family planning is status post hysterectomy.    Exercising: yes  walking Smoker:  no  Health Maintenance: Pap:  05-24-12 neg MMG:  2013 negative, has scheduled for 2015 Colonoscopy: 2012 5 years due to polyp BMD:   2014 per PCP will request record TDaP: within last few yrs, will check with pcp Labs: none Self breast exam: done monthly   reports that she has never smoked. She has never used smokeless tobacco. She reports that she drinks about 1.0 ounces of alcohol per week. She reports that she does not use illicit drugs.  Past Medical History  Diagnosis Date  . Hyperlipidemia   . Hypertension   . CAD (coronary artery disease)   . Deep vein thrombosis (DVT) 1987    REMOTE H/O; PT ALSO HAD 03/2012 A SOLEAL VEIN DVT LEFT LEG DUE TO FALL; WAS THEN STARTED ON XARELTO 04/22/12  . Depression   . Endometriosis   . Fibroid   . Anxiety     Past Surgical History  Procedure Laterality Date  . Joint replacement  2005    Left rotator cuff replacement   . Myocardial perfusion  03/27/02    NEGATIVE ADEQUATE BRUCE PROTOCOL W/DECONDITIONED EXERCISE RESPONSE, NORMAL STATIC AND DYNAMIC MYOCARDIAL PERFUSION IMAGES W/EF QGS 60%; LOW RSIK STUDY  . Cardiac catheterization  1990    PT HAD ANOTHER CATH 04/06/02; NORMAL-APPREARING CORONARY ARTERIES; NORMAL LV FUNCTION; NORMAL MITRAL AND AORTIC VALVES; NORMAL ABDOMINAL AORTA  AND RENAL ARTERIES  . Lower extremity venous duplex  04/21/12    FINDINGS CONSISTENT W/ACUTE DVT INVOLVING THE  LEFT SOLEAL VEIN; ALSO LEFT COMMON FEMORAL VEIN  . Carotid doppler  09/27/02    NORMAL COMMON AND INTERNAL CAROTID ARTERIES BILATERALLY W/NORMAL FLOW SEEN IN BOTH INTERNAL CAROTID ARTERIE. ; ANTEGRADE FLOW IN BOTH VERTEBRAL ARTERIES  . Abdominal hysterectomy  1987    TAH,BSO  . Breast surgery  04/2011    mass in left breast, fibrosis  . Patella fracture surgery Left 10/13    Current Outpatient Prescriptions  Medication Sig Dispense Refill  . acyclovir (ZOVIRAX) 800 MG tablet as needed.      Marland Kitchen aspirin 325 MG EC tablet Take 325 mg by mouth daily.        Marland Kitchen atorvastatin (LIPITOR) 40 MG tablet Take 40 mg by mouth daily.        Marland Kitchen CALCIUM PO Take by mouth daily.      . cloNIDine (CATAPRES) 0.1 MG tablet daily.      . Ergocalciferol (VITAMIN D2 PO) Take 50,000 Int'l Units by mouth every 7 (seven) days.       Marland Kitchen escitalopram (LEXAPRO) 10 MG tablet TAKE 1 TABLET BY MOUTH EVERY DAY  30 tablet  0  . ezetimibe (ZETIA) 10 MG tablet Take 10 mg by mouth daily.        . irbesartan (AVAPRO) 300 MG tablet daily.      . metoprolol succinate (TOPROL-XL) 25 MG 24 hr tablet Take 25 mg by mouth daily.        Marland Kitchen  Multiple Vitamins-Minerals (MULTIVITAMIN PO) Take by mouth daily.      . valsartan (DIOVAN) 320 MG tablet Take 320 mg by mouth daily.         No current facility-administered medications for this visit.    Family History  Problem Relation Age of Onset  . Cancer Father     lung; PASSED AWAY 08/21/94  . Hyperlipidemia Father   . Hypertension Father   . Heart disease Father 43  . Cancer Maternal Aunt     colon  . Cancer Maternal Grandmother     colon  . Aneurysm Mother     thoracic aneurysm  . Cancer Maternal Uncle     lung    ROS:  Pertinent items are noted in HPI.  Otherwise, a comprehensive ROS was negative.  Exam:   BP 124/68  Pulse 68  Resp 16  Ht 5' 8.25" (1.734 m)  Wt 187 lb (84.823 kg)  BMI 28.21 kg/m2  LMP 07/20/1985 Height: 5' 8.25" (173.4 cm)  Ht Readings from Last 3  Encounters:  07/18/13 5' 8.25" (1.734 m)  12/08/12 5\' 8"  (1.727 m)  05/14/11 5\' 8"  (1.727 m)    General appearance: alert, cooperative and appears stated age Head: Normocephalic, without obvious abnormality, atraumatic Neck: no adenopathy, supple, symmetrical, trachea midline and thyroid normal to inspection and palpation and non-palpable Lungs: clear to auscultation bilaterally Breasts: normal appearance, no masses or tenderness, No nipple retraction or dimpling, No nipple discharge or bleeding, No axillary or supraclavicular adenopathy Heart: regular rate and rhythm Abdomen: soft, non-tender; no masses,  no organomegaly Extremities: extremities normal, atraumatic, no cyanosis or edema Skin: Skin color, texture, turgor normal. No rashes or lesions Lymph nodes: Cervical, supraclavicular, and axillary nodes normal. No abnormal inguinal nodes palpated Neurologic: Grossly normal   Pelvic: External genitalia:  no lesions              Urethra:  normal appearing urethra with no masses, tenderness or lesions              Bartholin's and Skene's: normal                 Vagina: normal appearing vagina with normal color and discharge, no lesions              Cervix: absent              Pap taken: no Bimanual Exam:  Uterus:  uterus absent              Adnexa: no mass, fullness, tenderness and adnexa absent bilateral               Rectovaginal: Confirms               Anus:  normal sphincter tone, no lesions  A:  Well Woman with normal exam  Menopausal no HRT. S/P TAH,BSO for fibroid/endometrosis   Hypertension/AnxietyDepression stable on medication with PCP management  History of DVT in left leg, on aspirin now  Coronary artery disease with cardiology follow up  Family history of colon cancer (mgm)  P:   Reviewed health and wellness pertinent to exam  Continue follow up with PCP/cardiology as indicated  Pap smear as per guidelines, discussed not recommended after hysterectomy unless  indicated.  Mammogram yearly stressed pap smear not taken today  counseled on breast self exam, mammography screening, adequate intake of calcium and vitamin D, diet and exercise, Kegel's exercises  return annually or prn  An After Visit  Summary was printed and given to the patient.

## 2013-07-18 NOTE — Progress Notes (Signed)
Reviewed personally.  M. Suzanne Willo Yoon, MD.  

## 2013-07-18 NOTE — Patient Instructions (Signed)

## 2013-09-10 ENCOUNTER — Other Ambulatory Visit: Payer: Self-pay | Admitting: Certified Nurse Midwife

## 2013-09-11 NOTE — Telephone Encounter (Signed)
Last refilled: 06/26/13 30/0 Refills Last AEX: 07/18/13 AEX Scheduled: 08/02/14  Please Advise.

## 2013-09-11 NOTE — Telephone Encounter (Signed)
Need her chart.

## 2013-11-07 ENCOUNTER — Other Ambulatory Visit: Payer: Self-pay

## 2013-11-07 DIAGNOSIS — Z1231 Encounter for screening mammogram for malignant neoplasm of breast: Secondary | ICD-10-CM

## 2013-11-24 ENCOUNTER — Ambulatory Visit
Admission: RE | Admit: 2013-11-24 | Discharge: 2013-11-24 | Disposition: A | Discharge status: Home / Self Care | Payer: Medicare Other | Source: Ambulatory Visit

## 2013-11-24 ENCOUNTER — Encounter (INDEPENDENT_AMBULATORY_CARE_PROVIDER_SITE_OTHER): Payer: Self-pay

## 2013-11-24 DIAGNOSIS — Z1231 Encounter for screening mammogram for malignant neoplasm of breast: Secondary | ICD-10-CM | POA: Diagnosis not present

## 2013-12-05 ENCOUNTER — Telehealth: Payer: Self-pay | Admitting: Internal Medicine

## 2013-12-06 ENCOUNTER — Telehealth: Payer: Self-pay | Admitting: Internal Medicine

## 2013-12-07 NOTE — Telephone Encounter (Signed)
Closed encounter °

## 2013-12-13 ENCOUNTER — Ambulatory Visit: Payer: Medicare Other | Admitting: Internal Medicine

## 2014-01-09 ENCOUNTER — Telehealth: Payer: Self-pay | Admitting: Internal Medicine

## 2014-01-09 ENCOUNTER — Ambulatory Visit: Payer: Medicare Other | Admitting: Internal Medicine

## 2014-01-10 NOTE — Telephone Encounter (Signed)
Closed encounter °

## 2014-01-11 ENCOUNTER — Ambulatory Visit (INDEPENDENT_AMBULATORY_CARE_PROVIDER_SITE_OTHER): Payer: Medicare Other | Admitting: Internal Medicine

## 2014-01-11 ENCOUNTER — Encounter: Payer: Self-pay | Admitting: Internal Medicine

## 2014-01-11 VITALS — BP 190/85 | HR 79 | Ht 68.0 in | Wt 183.8 lb

## 2014-01-11 DIAGNOSIS — I10 Essential (primary) hypertension: Secondary | ICD-10-CM

## 2014-01-11 DIAGNOSIS — E785 Hyperlipidemia, unspecified: Secondary | ICD-10-CM

## 2014-01-11 NOTE — Patient Instructions (Signed)
Your physician wants you to follow-up in: 1 year. You will receive a reminder letter in the mail two months in advance. If you don't receive a letter, please call our office to schedule the follow-up appointment.  

## 2014-01-16 ENCOUNTER — Encounter: Payer: Self-pay | Admitting: Internal Medicine

## 2014-01-16 NOTE — Progress Notes (Signed)
OFFICE NOTE  Chief Complaint:  Followup DVT  Primary Care Physician: Dwan Bolt, MD  HPI:  Katie Collins is a 68 year old female formerly followed by Dr. Glade Lloyd for coronary disease. She had percutaneous intervention without a stent in 1999 and had a cath in 2003 which showed no significant stenoses. She has been maintained on aspirin and Plavix up until recently when she had a fall and developed a left leg fracture, I believe it was her patella. She seemed to recover from that with splinting, however, then developed swelling and redness of her left lower extremity and was found to have a soleal vein DVT, and this was at the end of September; she was started on Xarelto October 4th and her aspirin and Plavix were held. She is sent to me today for anticoagulation management given her history of coronary disease, also to reestablish with cardiology. Overall since she started on the Xarelto she has been doing much better. There is decreased swelling, although it is persistent in the left lower extremity. Her pain has improved, and she has had no adverse bleeding issues with the Xarelto other than easy bruising. Interestingly she reported a history of clot secondary to hysterectomy in 1987 and therefore may be hypercoagulable. I am not clear as to whether she has had a workup for hypercoagulability or not in the past. She returns for a six-month visit today he reports doing well. She is intermittently been wearing a stocking to help prevent post-thrombotic syndrome and has noted a marked decrease in swelling in her left leg. Her only other concern today is that her mother was diagnosed with an aortic aneurysm, however she is in her 48. Her cardiovascular specialist recommended that the entire family be screened for aortopathy's, suggesting that there is a possible genetic component to this.    [name] returns today and has no significant complaints. She occasionally gets some  dizziness but has no significant cardiac chest pain. Blood pressure was noted to be markedly elevated today at 190/85 but however did come down with a repeat blood pressure check.  PMHx:  Past Medical History  Diagnosis Date  . Hyperlipidemia   . Hypertension   . CAD (coronary artery disease)   . Deep vein thrombosis (DVT) 1987    REMOTE H/O; PT ALSO HAD 03/2012 A SOLEAL VEIN DVT LEFT LEG DUE TO FALL; WAS THEN STARTED ON XARELTO 04/22/12  . Depression   . Endometriosis   . Fibroid   . Anxiety     Past Surgical History  Procedure Laterality Date  . Joint replacement  2005    Left rotator cuff replacement   . Myocardial perfusion  03/27/02    NEGATIVE ADEQUATE BRUCE PROTOCOL W/DECONDITIONED EXERCISE RESPONSE, NORMAL STATIC AND DYNAMIC MYOCARDIAL PERFUSION IMAGES W/EF QGS 60%; LOW RSIK STUDY  . Cardiac catheterization  1990    PT HAD ANOTHER CATH 04/06/02; [place]; NORMAL LV FUNCTION; NORMAL MITRAL AND AORTIC VALVES; NORMAL ABDOMINAL AORTA  AND RENAL ARTERIES  . Lower extremity venous duplex  04/21/12    FINDINGS CONSISTENT W/ACUTE DVT INVOLVING THE LEFT SOLEAL VEIN; ALSO LEFT COMMON FEMORAL VEIN  . Carotid doppler  09/27/02    NORMAL COMMON AND INTERNAL CAROTID ARTERIES BILATERALLY W/NORMAL FLOW SEEN IN BOTH INTERNAL CAROTID ARTERIE. ; ANTEGRADE FLOW IN BOTH VERTEBRAL ARTERIES  . Abdominal hysterectomy  1987    TAH,BSO  . Breast surgery  04/2011    mass in left breast, fibrosis  . Patella fracture surgery Left  10/13    FAMHx:  Family History  Problem Relation Age of Onset  . Cancer Father     lung; PASSED AWAY 1994/07/29  . Hyperlipidemia Father   . Hypertension Father   . Heart disease Father 62  . Cancer Maternal Aunt     colon  . Cancer Maternal Grandmother     colon  . Aneurysm Mother     thoracic aneurysm  . Cancer Maternal Uncle     lung    SOCHx:   reports that she has never smoked. She has never used smokeless tobacco. She reports that she  drinks about one ounce of alcohol per week. She reports that she does not use illicit drugs.  ALLERGIES:  No Known Allergies  ROS: A comprehensive review of systems was negative.  HOME MEDS: Current Outpatient Prescriptions  Medication Sig Dispense Refill  . acyclovir (ZOVIRAX) 800 MG tablet as needed.      Marland Kitchen aspirin 325 MG EC tablet Take 325 mg by mouth daily.        Marland Kitchen atorvastatin (LIPITOR) 40 MG tablet Take 40 mg by mouth daily.        Marland Kitchen CALCIUM PO Take by mouth daily.      . cloNIDine (CATAPRES) 0.1 MG tablet daily.      . Ergocalciferol (VITAMIN D2 PO) Take 50,000 Int'l Units by mouth every 7 (seven) days.       Marland Kitchen escitalopram (LEXAPRO) 10 MG tablet TAKE 1 TABLET BY MOUTH EVERY DAY  30 tablet  2  . ezetimibe (ZETIA) 10 MG tablet Take 10 mg by mouth daily.        . irbesartan (AVAPRO) 300 MG tablet daily.      . metoprolol succinate (TOPROL-XL) 25 MG 24 hr tablet Take 25 mg by mouth daily.        . Multiple Vitamins-Minerals (MULTIVITAMIN PO) Take by mouth daily.      . valsartan (DIOVAN) 320 MG tablet Take 320 mg by mouth daily.         No current facility-administered medications for this visit.    LABS/IMAGING: No results found for this or any previous visit (from the past 48 hour(s)). No results found.  VITALS: BP 190/85  Pulse 79  Ht 5\' 8"  (1.727 m)  Wt 183 lb 12.8 oz (83.371 kg)  BMI 27.95 kg/m2  LMP 1985/07/29  EXAM: General appearance: alert and no distress Neck: no adenopathy, no carotid bruit, no JVD, supple, symmetrical, trachea midline and thyroid not enlarged, symmetric, no tenderness/mass/nodules Lungs: clear to auscultation bilaterally Heart: regular rate and rhythm, S1, S2 normal, no murmur, click, rub or gallop Abdomen: soft, non-tender; bowel sounds normal; no masses,  no organomegaly Extremities: extremities normal, atraumatic, no cyanosis or edema Pulses: 2+ and symmetric Skin: Warm, moist Neurologic: Grossly normal  EKG: Normal sinus rhythm  with marked sinus arrhythmia at 79  ASSESSMENT: 1. Left soleal deep vein thrombosis-resolved 2. Hypertension-uncontrolled 3. Dyslipidemia 4. History of coronary artery disease status post plain old balloon angioplasty in 1999 5. Depression 6. Fatigue and night sweats, without weight loss  PLAN: 1.   Ms. Cardosa is doing well. Her hypertension still is not at goal. However repeat blood pressure check didn't come down to about 140/85. She reports her blood pressures are always better than that at home. I will make no medication adjustments at this time and will plan to see her back annually or sooner as necessary. She is advised to follow her blood pressures at  home and contact us if they are persistently running greater than 482 systolic.  Pixie Casino, MD, [facility] Attending Cardiologist [facility] 01/16/2014, 5:44 PM

## 2014-01-17 DIAGNOSIS — I10 Essential (primary) hypertension: Secondary | ICD-10-CM | POA: Diagnosis not present

## 2014-01-17 DIAGNOSIS — E789 Disorder of lipoprotein metabolism, unspecified: Secondary | ICD-10-CM | POA: Diagnosis not present

## 2014-01-17 DIAGNOSIS — Z79899 Other long term (current) drug therapy: Secondary | ICD-10-CM | POA: Diagnosis not present

## 2014-01-24 DIAGNOSIS — I10 Essential (primary) hypertension: Secondary | ICD-10-CM | POA: Diagnosis not present

## 2014-01-24 DIAGNOSIS — E789 Disorder of lipoprotein metabolism, unspecified: Secondary | ICD-10-CM | POA: Diagnosis not present

## 2014-01-24 DIAGNOSIS — I82409 Acute embolism and thrombosis of unspecified deep veins of unspecified lower extremity: Secondary | ICD-10-CM | POA: Diagnosis not present

## 2014-01-25 ENCOUNTER — Other Ambulatory Visit: Payer: Self-pay | Admitting: Endocrinology

## 2014-01-25 DIAGNOSIS — R911 Solitary pulmonary nodule: Secondary | ICD-10-CM

## 2014-02-21 ENCOUNTER — Other Ambulatory Visit: Payer: Self-pay | Admitting: Dermatology

## 2014-02-21 DIAGNOSIS — D485 Neoplasm of uncertain behavior of skin: Secondary | ICD-10-CM | POA: Diagnosis not present

## 2014-02-21 DIAGNOSIS — L570 Actinic keratosis: Secondary | ICD-10-CM | POA: Diagnosis not present

## 2014-02-21 DIAGNOSIS — L819 Disorder of pigmentation, unspecified: Secondary | ICD-10-CM | POA: Diagnosis not present

## 2014-02-21 DIAGNOSIS — C4359 Malignant melanoma of other part of trunk: Secondary | ICD-10-CM | POA: Diagnosis not present

## 2014-02-21 DIAGNOSIS — L821 Other seborrheic keratosis: Secondary | ICD-10-CM | POA: Diagnosis not present

## 2014-02-21 DIAGNOSIS — B079 Viral wart, unspecified: Secondary | ICD-10-CM | POA: Diagnosis not present

## 2014-02-21 DIAGNOSIS — D235 Other benign neoplasm of skin of trunk: Secondary | ICD-10-CM | POA: Diagnosis not present

## 2014-03-13 ENCOUNTER — Ambulatory Visit
Admission: RE | Admit: 2014-03-13 | Discharge: 2014-03-13 | Disposition: A | Discharge status: Home / Self Care | Payer: Medicare Other | Source: Ambulatory Visit | Attending: Endocrinology | Admitting: Endocrinology

## 2014-03-13 DIAGNOSIS — R911 Solitary pulmonary nodule: Secondary | ICD-10-CM | POA: Diagnosis not present

## 2014-03-13 MED ORDER — IOHEXOL 300 MG/ML  SOLN
75.0000 mL | Freq: Once | INTRAMUSCULAR | Status: AC | PRN
  Administered 2014-03-13: 75 mL via INTRAVENOUS

## 2014-03-19 ENCOUNTER — Other Ambulatory Visit: Payer: Self-pay | Admitting: Certified Nurse Midwife

## 2014-03-19 NOTE — Telephone Encounter (Addendum)
Last AEX 07/18/13 Last refill 09/11/12 #30/2 R Next appt 08/02/14  Please approve or deny Rx.

## 2014-04-03 ENCOUNTER — Other Ambulatory Visit: Payer: Self-pay | Admitting: Dermatology

## 2014-04-03 DIAGNOSIS — C4359 Malignant melanoma of other part of trunk: Secondary | ICD-10-CM | POA: Diagnosis not present

## 2014-04-03 DIAGNOSIS — D485 Neoplasm of uncertain behavior of skin: Secondary | ICD-10-CM | POA: Diagnosis not present

## 2014-05-21 ENCOUNTER — Encounter: Payer: Self-pay | Admitting: Internal Medicine

## 2014-05-21 DIAGNOSIS — E559 Vitamin D deficiency, unspecified: Secondary | ICD-10-CM | POA: Diagnosis not present

## 2014-05-23 ENCOUNTER — Other Ambulatory Visit: Payer: Self-pay

## 2014-05-23 NOTE — Telephone Encounter (Signed)
My note from 2014 says PCP management of Lexapro

## 2014-05-23 NOTE — Telephone Encounter (Signed)
Incoming Refill Request from [facility]:[id number] 10mg   Last AEX:07/18/13 Last Refill:03/21/14 #30 X 2 Next AEX:08/02/14  Pt's INS is requesting a 90 days supply. Ok to send?   Please Advise

## 2014-05-28 DIAGNOSIS — R030 Elevated blood-pressure reading, without diagnosis of hypertension: Secondary | ICD-10-CM | POA: Diagnosis not present

## 2014-05-28 DIAGNOSIS — E780 Pure hypercholesterolemia: Secondary | ICD-10-CM | POA: Diagnosis not present

## 2014-05-28 DIAGNOSIS — C439 Malignant melanoma of skin, unspecified: Secondary | ICD-10-CM | POA: Diagnosis not present

## 2014-05-28 DIAGNOSIS — Z23 Encounter for immunization: Secondary | ICD-10-CM | POA: Diagnosis not present

## 2014-07-05 DIAGNOSIS — Z85820 Personal history of malignant melanoma of skin: Secondary | ICD-10-CM | POA: Diagnosis not present

## 2014-07-05 DIAGNOSIS — Z08 Encounter for follow-up examination after completed treatment for malignant neoplasm: Secondary | ICD-10-CM | POA: Diagnosis not present

## 2014-08-02 ENCOUNTER — Ambulatory Visit (INDEPENDENT_AMBULATORY_CARE_PROVIDER_SITE_OTHER): Payer: Medicare Other | Admitting: Certified Nurse Midwife

## 2014-08-02 ENCOUNTER — Encounter: Payer: Self-pay | Admitting: Certified Nurse Midwife

## 2014-08-02 VITALS — BP 114/80 | HR 72 | Resp 16 | Ht 68.75 in | Wt 178.0 lb

## 2014-08-02 DIAGNOSIS — Z01419 Encounter for gynecological examination (general) (routine) without abnormal findings: Secondary | ICD-10-CM

## 2014-08-02 NOTE — Patient Instructions (Signed)

## 2014-08-02 NOTE — Progress Notes (Signed)
Reviewed personally.  M. Suzanne Delcenia Inman, MD.  

## 2014-08-02 NOTE — Progress Notes (Signed)
69 y.o. G44P2002 Married  Caucasian Fe here for annual exam.  Menopausal no HRT. Denies vaginal bleeding or vaginal dryness. Continues follow up with cardiology and PCP for medication management for hypertension/cholesterol and labs and aex. Patient in 9/15 for skin exam with dermatology with Melanoma noted on stomach, with removal with margin clear. Under follow up now. No other health issues today.  Patient's last menstrual period was 07/20/1985.          Sexually active: Yes.    The current method of family planning is status post hysterectomy.    Exercising: Yes.    walking Smoker:  no  Health Maintenance: Pap:  05-24-12 neg MMG:  11-24-13 category b density, birads 1:neg Colonoscopy:  2012 f/u 50yrs due to polyp BMD:   2014 ? Results patient will sign for records of BMD TDaP:  Within last few yrs Labs: none Self breast exam: done monthly   reports that she has never smoked. She has never used smokeless tobacco. She reports that she drinks about 1.2 - 1.8 oz of alcohol per week. She reports that she does not use illicit drugs.  Past Medical History  Diagnosis Date  . Hyperlipidemia   . Hypertension   . CAD (coronary artery disease)   . Deep vein thrombosis (DVT) 1987    REMOTE H/O; PT ALSO HAD 03/2012 A SOLEAL VEIN DVT LEFT LEG DUE TO FALL; WAS THEN STARTED ON XARELTO 04/22/12  . Depression   . Endometriosis   . Fibroid   . Anxiety     Past Surgical History  Procedure Laterality Date  . Joint replacement  2005    Left rotator cuff replacement   . Myocardial perfusion  03/27/02    NEGATIVE ADEQUATE BRUCE PROTOCOL W/DECONDITIONED EXERCISE RESPONSE, NORMAL STATIC AND DYNAMIC MYOCARDIAL PERFUSION IMAGES W/EF QGS 60%; LOW RSIK STUDY  . Cardiac catheterization  1990    PT HAD ANOTHER CATH 04/06/02; [place]; NORMAL LV FUNCTION; NORMAL MITRAL AND AORTIC VALVES; NORMAL ABDOMINAL AORTA  AND RENAL ARTERIES  . Lower extremity venous duplex  04/21/12    FINDINGS  CONSISTENT W/ACUTE DVT INVOLVING THE LEFT SOLEAL VEIN; ALSO LEFT COMMON FEMORAL VEIN  . Carotid doppler  09/27/02    NORMAL COMMON AND INTERNAL CAROTID ARTERIES BILATERALLY W/NORMAL FLOW SEEN IN BOTH INTERNAL CAROTID ARTERIE. ; ANTEGRADE FLOW IN BOTH VERTEBRAL ARTERIES  . Abdominal hysterectomy  1987    TAH,BSO  . Breast surgery  04/2011    mass in left breast, fibrosis  . Patella fracture surgery Left 10/13    Current Outpatient Prescriptions  Medication Sig Dispense Refill  . acyclovir (ZOVIRAX) 800 MG tablet as needed.    Marland Kitchen aspirin 325 MG EC tablet Take 325 mg by mouth daily.      Marland Kitchen atorvastatin (LIPITOR) 40 MG tablet Take 40 mg by mouth daily.      Marland Kitchen CALCIUM PO Take by mouth daily.    . Cholecalciferol (VITAMIN D PO) Take 10,000 Int'l Units by mouth once a week.    . cloNIDine (CATAPRES) 0.2 MG tablet   5  . escitalopram (LEXAPRO) 10 MG tablet TAKE 1 TABLET BY MOUTH EVERY DAY 30 tablet 2  . ezetimibe (ZETIA) 10 MG tablet Take 10 mg by mouth daily.      . irbesartan (AVAPRO) 300 MG tablet daily.    . Multiple Vitamins-Minerals (MULTIVITAMIN PO) Take by mouth daily.    . valsartan (DIOVAN) 320 MG tablet Take 320 mg by mouth daily.      Marland Kitchen  metoprolol succinate (TOPROL-XL) 50 MG 24 hr tablet Take 50 mg by mouth daily.  12   No current facility-administered medications for this visit.    Family History  Problem Relation Age of Onset  . Cancer Father     lung; PASSED AWAY 1994/08/20  . Hyperlipidemia Father   . Hypertension Father   . Heart disease Father 45  . Cancer Maternal Aunt     colon  . Cancer Maternal Grandmother     colon  . Aneurysm Mother     thoracic aneurysm  . Cancer Maternal Uncle     lung    ROS:  Pertinent items are noted in HPI.  Otherwise, a comprehensive ROS was negative.  Exam:   BP 114/80 mmHg  Pulse 72  Resp 16  Ht 5' 8.75" (1.746 m)  Wt 178 lb (80.74 kg)  BMI 26.49 kg/m2  LMP 07/20/1985 Height: 5' 8.75" (174.6 cm) Ht Readings from Last 3  Encounters:  08/02/14 5' 8.75" (1.746 m)  01/11/14 5\' 8"  (1.727 m)  07/18/13 5' 8.25" (1.734 m)    General appearance: alert, cooperative and appears stated age Head: Normocephalic, without obvious abnormality, atraumatic Neck: no adenopathy, supple, symmetrical, trachea midline and thyroid normal to inspection and palpation Lungs: clear to auscultation bilaterally Breasts: normal appearance, no masses or tenderness, No nipple retraction or dimpling, No nipple discharge or bleeding, No axillary or supraclavicular adenopathy Heart: regular rate and rhythm Abdomen: soft, non-tender; no masses,  no organomegaly Extremities: extremities normal, atraumatic, no cyanosis or edema Skin: Skin color, texture, turgor normal. No rashes or lesions Lymph nodes: Cervical, supraclavicular, and axillary nodes normal. No abnormal inguinal nodes palpated Neurologic: Grossly normal   Pelvic: External genitalia:  no lesions              Urethra:  normal appearing urethra with no masses, tenderness or lesions              Bartholin's and Skene's: normal                 Vagina: normal appearing vagina with normal color and discharge, no lesions              Cervix: absent              Pap taken: No. Bimanual Exam:  Uterus:  uterus absent              Adnexa: no mass, fullness, tenderness adnexa bilateral absent               Rectovaginal: Confirms               Anus:  normal sphincter tone, no lesions  Chaperone present: No  A:  Well Woman with normal exam  Menopausal no HRT. S/P TAH with BSO bleeding  Hypertension/Cholesterol and Depression managed by cardiology and PCP. Stable medication per patient.  Family History of colon cancer mgm,ma    P:   Reviewed health and wellness pertinent to exam  Continue follow up with MD as indicated  Pap smear not taken today  Recent occurrence of Melanoma on abdomen with margins clear under follow up with dermatology  counseled on breast self exam, mammography  screening, adequate intake of calcium and vitamin D, diet and exercise, Kegel's exercises  return annually or prn  An After Visit Summary was printed and given to the patient.

## 2014-08-16 ENCOUNTER — Other Ambulatory Visit: Payer: Self-pay | Admitting: Certified Nurse Midwife

## 2014-08-16 NOTE — Telephone Encounter (Signed)
Medication refill request: Lexapro 10 mg  Last AEX:  08/02/14 with Ms. Debbie Next AEX: 08/05/15 with Ms. Debbie Last MMG (if hormonal medication request): N/A  Refill authorized: Please advise.

## 2014-09-24 DIAGNOSIS — E789 Disorder of lipoprotein metabolism, unspecified: Secondary | ICD-10-CM | POA: Diagnosis not present

## 2014-09-26 DIAGNOSIS — E789 Disorder of lipoprotein metabolism, unspecified: Secondary | ICD-10-CM | POA: Diagnosis not present

## 2014-09-26 DIAGNOSIS — I10 Essential (primary) hypertension: Secondary | ICD-10-CM | POA: Diagnosis not present

## 2014-10-09 ENCOUNTER — Other Ambulatory Visit (HOSPITAL_COMMUNITY): Payer: Self-pay | Admitting: Internal Medicine

## 2014-10-09 ENCOUNTER — Other Ambulatory Visit (HOSPITAL_COMMUNITY): Payer: Self-pay | Admitting: Endocrinology

## 2014-10-09 ENCOUNTER — Encounter (HOSPITAL_COMMUNITY): Payer: Self-pay

## 2014-10-09 ENCOUNTER — Ambulatory Visit (HOSPITAL_COMMUNITY)
Admission: RE | Admit: 2014-10-09 | Discharge: 2014-10-09 | Disposition: A | Discharge status: Home / Self Care | Payer: Medicare Other | Source: Ambulatory Visit | Attending: Endocrinology | Admitting: Endocrinology

## 2014-10-09 DIAGNOSIS — H7011 Chronic mastoiditis, right ear: Secondary | ICD-10-CM | POA: Insufficient documentation

## 2014-10-09 DIAGNOSIS — S0990XS Unspecified injury of head, sequela: Secondary | ICD-10-CM

## 2014-10-09 DIAGNOSIS — E041 Nontoxic single thyroid nodule: Secondary | ICD-10-CM | POA: Diagnosis not present

## 2014-10-09 DIAGNOSIS — W102XXA Fall (on)(from) incline, initial encounter: Secondary | ICD-10-CM

## 2014-10-09 DIAGNOSIS — E785 Hyperlipidemia, unspecified: Secondary | ICD-10-CM | POA: Insufficient documentation

## 2014-10-09 DIAGNOSIS — R55 Syncope and collapse: Secondary | ICD-10-CM | POA: Diagnosis not present

## 2014-10-09 DIAGNOSIS — W19XXXA Unspecified fall, initial encounter: Secondary | ICD-10-CM | POA: Insufficient documentation

## 2014-10-09 DIAGNOSIS — R51 Headache: Secondary | ICD-10-CM | POA: Insufficient documentation

## 2014-10-09 DIAGNOSIS — S0990XD Unspecified injury of head, subsequent encounter: Secondary | ICD-10-CM

## 2014-10-09 DIAGNOSIS — G44309 Post-traumatic headache, unspecified, not intractable: Secondary | ICD-10-CM

## 2014-10-09 DIAGNOSIS — I10 Essential (primary) hypertension: Secondary | ICD-10-CM | POA: Diagnosis not present

## 2014-10-09 DIAGNOSIS — M542 Cervicalgia: Secondary | ICD-10-CM | POA: Diagnosis not present

## 2014-10-09 DIAGNOSIS — J323 Chronic sphenoidal sinusitis: Secondary | ICD-10-CM | POA: Insufficient documentation

## 2014-10-09 DIAGNOSIS — S199XXA Unspecified injury of neck, initial encounter: Secondary | ICD-10-CM | POA: Diagnosis not present

## 2014-10-09 DIAGNOSIS — S0990XA Unspecified injury of head, initial encounter: Secondary | ICD-10-CM | POA: Diagnosis not present

## 2014-10-10 ENCOUNTER — Ambulatory Visit (HOSPITAL_COMMUNITY): Admission: RE | Admit: 2014-10-10 | Payer: Medicare Other | Source: Ambulatory Visit

## 2014-10-11 DIAGNOSIS — M25512 Pain in left shoulder: Secondary | ICD-10-CM | POA: Diagnosis not present

## 2015-01-03 DIAGNOSIS — L814 Other melanin hyperpigmentation: Secondary | ICD-10-CM | POA: Diagnosis not present

## 2015-01-03 DIAGNOSIS — Z08 Encounter for follow-up examination after completed treatment for malignant neoplasm: Secondary | ICD-10-CM | POA: Diagnosis not present

## 2015-01-03 DIAGNOSIS — D225 Melanocytic nevi of trunk: Secondary | ICD-10-CM | POA: Diagnosis not present

## 2015-01-03 DIAGNOSIS — Z85820 Personal history of malignant melanoma of skin: Secondary | ICD-10-CM | POA: Diagnosis not present

## 2015-01-03 DIAGNOSIS — L820 Inflamed seborrheic keratosis: Secondary | ICD-10-CM | POA: Diagnosis not present

## 2015-02-01 DIAGNOSIS — E559 Vitamin D deficiency, unspecified: Secondary | ICD-10-CM | POA: Diagnosis not present

## 2015-02-01 DIAGNOSIS — E789 Disorder of lipoprotein metabolism, unspecified: Secondary | ICD-10-CM | POA: Diagnosis not present

## 2015-02-01 DIAGNOSIS — Z79899 Other long term (current) drug therapy: Secondary | ICD-10-CM | POA: Diagnosis not present

## 2015-02-01 DIAGNOSIS — I10 Essential (primary) hypertension: Secondary | ICD-10-CM | POA: Diagnosis not present

## 2015-02-18 ENCOUNTER — Other Ambulatory Visit: Payer: Self-pay | Admitting: Endocrinology

## 2015-02-18 ENCOUNTER — Telehealth: Payer: Self-pay | Admitting: Internal Medicine

## 2015-02-18 DIAGNOSIS — Z1231 Encounter for screening mammogram for malignant neoplasm of breast: Secondary | ICD-10-CM

## 2015-02-19 DIAGNOSIS — R05 Cough: Secondary | ICD-10-CM | POA: Diagnosis not present

## 2015-02-19 DIAGNOSIS — E789 Disorder of lipoprotein metabolism, unspecified: Secondary | ICD-10-CM | POA: Diagnosis not present

## 2015-02-19 DIAGNOSIS — I10 Essential (primary) hypertension: Secondary | ICD-10-CM | POA: Diagnosis not present

## 2015-02-19 NOTE — Telephone Encounter (Signed)
Close encounter 

## 2015-03-29 ENCOUNTER — Ambulatory Visit
Admission: RE | Admit: 2015-03-29 | Discharge: 2015-03-29 | Disposition: A | Discharge status: Home / Self Care | Payer: Medicare Other | Source: Ambulatory Visit | Attending: Endocrinology | Admitting: Endocrinology

## 2015-03-29 ENCOUNTER — Other Ambulatory Visit: Payer: Self-pay | Admitting: Obstetrics and Gynecology

## 2015-03-29 DIAGNOSIS — Z1231 Encounter for screening mammogram for malignant neoplasm of breast: Secondary | ICD-10-CM

## 2015-03-29 DIAGNOSIS — N63 Unspecified lump in unspecified breast: Secondary | ICD-10-CM

## 2015-04-05 ENCOUNTER — Other Ambulatory Visit: Payer: Self-pay | Admitting: Endocrinology

## 2015-04-05 DIAGNOSIS — N63 Unspecified lump in unspecified breast: Secondary | ICD-10-CM

## 2015-04-10 ENCOUNTER — Ambulatory Visit (INDEPENDENT_AMBULATORY_CARE_PROVIDER_SITE_OTHER): Payer: Medicare Other | Admitting: Emergency Medicine

## 2015-04-10 ENCOUNTER — Ambulatory Visit (INDEPENDENT_AMBULATORY_CARE_PROVIDER_SITE_OTHER): Payer: Medicare Other

## 2015-04-10 DIAGNOSIS — R0789 Other chest pain: Secondary | ICD-10-CM | POA: Diagnosis not present

## 2015-04-10 DIAGNOSIS — M542 Cervicalgia: Secondary | ICD-10-CM

## 2015-04-10 MED ORDER — ALPRAZOLAM 0.25 MG PO TABS: ORAL_TABLET | ORAL | Status: DC
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
## 2015-04-16 ENCOUNTER — Ambulatory Visit (INDEPENDENT_AMBULATORY_CARE_PROVIDER_SITE_OTHER): Payer: Medicare Other | Admitting: Internal Medicine

## 2015-04-16 VITALS — BP 144/104 | HR 63 | Ht 68.0 in | Wt 174.5 lb

## 2015-04-16 DIAGNOSIS — I10 Essential (primary) hypertension: Secondary | ICD-10-CM

## 2015-04-16 DIAGNOSIS — E785 Hyperlipidemia, unspecified: Secondary | ICD-10-CM

## 2015-04-16 NOTE — Patient Instructions (Addendum)
Your physician wants you to follow-up in: 1 year with Dr. Debara Pickett. You will receive a reminder letter in the mail two months in advance. If you don't receive a letter, please call our office to schedule the follow-up appointment.  Please call with your BP readings.   Please take valsartan in the morning.

## 2015-04-17 ENCOUNTER — Encounter: Payer: Self-pay | Admitting: Internal Medicine
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
## 2015-04-19 ENCOUNTER — Encounter: Payer: Self-pay | Admitting: Internal Medicine

## 2015-04-22 ENCOUNTER — Telehealth: Payer: Self-pay | Admitting: Internal Medicine

## 2015-04-22 DIAGNOSIS — Z79899 Other long term (current) drug therapy: Secondary | ICD-10-CM

## 2015-04-22 DIAGNOSIS — I10 Essential (primary) hypertension: Secondary | ICD-10-CM

## 2015-04-22 NOTE — Telephone Encounter (Signed)
Looks like BP's are not much better at home. Please Rx for HCTZ 25 mg daily to take with her diovan. Will need BMET next week.  If this works, we can ultimately combine them into 1 pill.  Thanks.  Dr. Lemmie Evens

## 2015-04-22 NOTE — Telephone Encounter (Signed)
Left patient message to return call and speak to triage nurse for further instruction.

## 2015-04-22 NOTE — Telephone Encounter (Signed)
Pt is calling in to give her BP readings

## 2015-04-22 NOTE — Telephone Encounter (Signed)
Patient called in to give BP readings after medication change.  9/27 -- 178/92 9/28 -- 163/84 am  160/73 pm 9/29 -- 177/83 am  161/74 pm 9/30 -- 179/68 10/1 -- 160/82 am  137/64 pm 10/2 -- 170/77  10/3 -- 168/79  Notes she did not know to record HR with these, but she states this is always between 51-58- she will record HR w/ BP going forward.  Routed to Dr. Debara Pickett to advise.

## 2015-04-23 MED ORDER — HYDROCHLOROTHIAZIDE 25 MG PO TABS: 25.0000 mg | ORAL_TABLET | Freq: Every day | ORAL | Status: DC

## 2015-04-23 NOTE — Telephone Encounter (Signed)
Left message at home and work phone to call back.Marland Kitchen

## 2015-04-23 NOTE — Telephone Encounter (Signed)
I spoke with the patient about information start HCTZ 25 MG daily. BMP IN ONE WEEK PATIENT VERBALIZED UNDERSTANDING.

## 2015-04-25 ENCOUNTER — Ambulatory Visit
Admission: RE | Admit: 2015-04-25 | Discharge: 2015-04-25 | Disposition: A | Discharge status: Home / Self Care | Payer: Medicare Other | Source: Ambulatory Visit | Attending: Endocrinology | Admitting: Endocrinology

## 2015-04-25 DIAGNOSIS — N63 Unspecified lump in unspecified breast: Secondary | ICD-10-CM

## 2015-04-30 ENCOUNTER — Telehealth: Payer: Self-pay | Admitting: Internal Medicine

## 2015-04-30 NOTE — Telephone Encounter (Signed)
Left message for patient to return call.

## 2015-04-30 NOTE — Telephone Encounter (Signed)
Please call,pt need to give you her blood pressure readings.

## 2015-05-01 DIAGNOSIS — Z79899 Other long term (current) drug therapy: Secondary | ICD-10-CM | POA: Diagnosis not present

## 2015-05-01 DIAGNOSIS — I10 Essential (primary) hypertension: Secondary | ICD-10-CM | POA: Diagnosis not present

## 2015-05-02 LAB — BASIC METABOLIC PANEL
BUN: 16 mg/dL (ref 7–25)
CO2: 32 mmol/L — ABNORMAL HIGH (ref 20–31)
Calcium: 9.4 mg/dL (ref 8.6–10.4)
Chloride: 100 mmol/L (ref 98–110)
Creat: 0.74 mg/dL (ref 0.50–0.99)
Glucose, Bld: 74 mg/dL (ref 65–99)
Potassium: 4 mmol/L (ref 3.5–5.3)
Sodium: 139 mmol/L (ref 135–146)

## 2015-05-13 ENCOUNTER — Encounter: Payer: Self-pay | Admitting: *Deleted

## 2015-06-27 DIAGNOSIS — Z23 Encounter for immunization: Secondary | ICD-10-CM | POA: Diagnosis not present

## 2015-07-04 DIAGNOSIS — L812 Freckles: Secondary | ICD-10-CM | POA: Diagnosis not present

## 2015-07-04 DIAGNOSIS — Z85820 Personal history of malignant melanoma of skin: Secondary | ICD-10-CM | POA: Diagnosis not present

## 2015-07-04 DIAGNOSIS — L821 Other seborrheic keratosis: Secondary | ICD-10-CM | POA: Diagnosis not present

## 2015-08-05 ENCOUNTER — Ambulatory Visit: Payer: Medicare Other | Admitting: Certified Nurse Midwife

## 2015-08-19 DIAGNOSIS — I10 Essential (primary) hypertension: Secondary | ICD-10-CM | POA: Diagnosis not present

## 2015-08-19 DIAGNOSIS — E789 Disorder of lipoprotein metabolism, unspecified: Secondary | ICD-10-CM | POA: Diagnosis not present

## 2015-08-22 DIAGNOSIS — E559 Vitamin D deficiency, unspecified: Secondary | ICD-10-CM | POA: Diagnosis not present

## 2015-08-22 DIAGNOSIS — E789 Disorder of lipoprotein metabolism, unspecified: Secondary | ICD-10-CM | POA: Diagnosis not present

## 2015-12-26 DIAGNOSIS — Z800 Family history of malignant neoplasm of digestive organs: Secondary | ICD-10-CM | POA: Diagnosis not present

## 2015-12-26 DIAGNOSIS — Z1211 Encounter for screening for malignant neoplasm of colon: Secondary | ICD-10-CM | POA: Diagnosis not present

## 2015-12-26 DIAGNOSIS — R194 Change in bowel habit: Secondary | ICD-10-CM | POA: Diagnosis not present

## 2015-12-26 DIAGNOSIS — Z86010 Personal history of colonic polyps: Secondary | ICD-10-CM | POA: Diagnosis not present

## 2015-12-30 DIAGNOSIS — Z1211 Encounter for screening for malignant neoplasm of colon: Secondary | ICD-10-CM | POA: Diagnosis not present

## 2015-12-30 DIAGNOSIS — K5730 Diverticulosis of large intestine without perforation or abscess without bleeding: Secondary | ICD-10-CM | POA: Diagnosis not present

## 2015-12-30 DIAGNOSIS — Z86010 Personal history of colonic polyps: Secondary | ICD-10-CM | POA: Diagnosis not present

## 2016-01-01 ENCOUNTER — Other Ambulatory Visit: Payer: Self-pay | Admitting: Internal Medicine

## 2016-01-06 DIAGNOSIS — N390 Urinary tract infection, site not specified: Secondary | ICD-10-CM | POA: Diagnosis not present

## 2016-01-06 DIAGNOSIS — E789 Disorder of lipoprotein metabolism, unspecified: Secondary | ICD-10-CM | POA: Diagnosis not present

## 2016-01-13 DIAGNOSIS — D235 Other benign neoplasm of skin of trunk: Secondary | ICD-10-CM | POA: Diagnosis not present

## 2016-01-13 DIAGNOSIS — Z85820 Personal history of malignant melanoma of skin: Secondary | ICD-10-CM | POA: Diagnosis not present

## 2016-01-13 DIAGNOSIS — L814 Other melanin hyperpigmentation: Secondary | ICD-10-CM | POA: Diagnosis not present

## 2016-01-13 DIAGNOSIS — D1801 Hemangioma of skin and subcutaneous tissue: Secondary | ICD-10-CM | POA: Diagnosis not present

## 2016-01-13 DIAGNOSIS — L821 Other seborrheic keratosis: Secondary | ICD-10-CM | POA: Diagnosis not present

## 2016-01-13 DIAGNOSIS — L820 Inflamed seborrheic keratosis: Secondary | ICD-10-CM | POA: Diagnosis not present

## 2016-02-14 DIAGNOSIS — E559 Vitamin D deficiency, unspecified: Secondary | ICD-10-CM | POA: Diagnosis not present

## 2016-02-14 DIAGNOSIS — I10 Essential (primary) hypertension: Secondary | ICD-10-CM | POA: Diagnosis not present

## 2016-02-14 DIAGNOSIS — E789 Disorder of lipoprotein metabolism, unspecified: Secondary | ICD-10-CM | POA: Diagnosis not present

## 2016-02-20 DIAGNOSIS — N390 Urinary tract infection, site not specified: Secondary | ICD-10-CM | POA: Diagnosis not present

## 2016-02-20 DIAGNOSIS — I10 Essential (primary) hypertension: Secondary | ICD-10-CM | POA: Diagnosis not present

## 2016-02-20 DIAGNOSIS — E789 Disorder of lipoprotein metabolism, unspecified: Secondary | ICD-10-CM | POA: Diagnosis not present

## 2016-06-16 ENCOUNTER — Other Ambulatory Visit: Payer: Self-pay | Admitting: Endocrinology

## 2016-06-16 DIAGNOSIS — Z1231 Encounter for screening mammogram for malignant neoplasm of breast: Secondary | ICD-10-CM

## 2016-06-17 ENCOUNTER — Ambulatory Visit
Admission: RE | Admit: 2016-06-17 | Discharge: 2016-06-17 | Disposition: A | Discharge status: Home / Self Care | Payer: Medicare Other | Source: Ambulatory Visit | Attending: Endocrinology | Admitting: Endocrinology

## 2016-06-17 DIAGNOSIS — Z1231 Encounter for screening mammogram for malignant neoplasm of breast: Secondary | ICD-10-CM | POA: Diagnosis not present

## 2016-06-22 DIAGNOSIS — Z23 Encounter for immunization: Secondary | ICD-10-CM | POA: Diagnosis not present

## 2016-07-27 ENCOUNTER — Ambulatory Visit (INDEPENDENT_AMBULATORY_CARE_PROVIDER_SITE_OTHER): Payer: Medicare Other | Admitting: Internal Medicine

## 2016-07-27 ENCOUNTER — Encounter: Payer: Self-pay | Admitting: Internal Medicine

## 2016-07-27 VITALS — BP 122/70 | HR 55 | Ht 68.0 in | Wt 169.8 lb

## 2016-07-27 DIAGNOSIS — I25810 Atherosclerosis of coronary artery bypass graft(s) without angina pectoris: Secondary | ICD-10-CM | POA: Diagnosis not present

## 2016-07-27 DIAGNOSIS — I10 Essential (primary) hypertension: Secondary | ICD-10-CM

## 2016-07-27 DIAGNOSIS — E782 Mixed hyperlipidemia: Secondary | ICD-10-CM | POA: Diagnosis not present
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
## 2016-07-27 NOTE — Patient Instructions (Signed)
Your physician wants you to follow-up in: ONE YEAR with Dr. Hilty. You will receive a reminder letter in the mail two months in advance. If you don't receive a letter, please call our office to schedule the follow-up appointment.  

## 2016-07-29 NOTE — Addendum Note (Signed)
Addended by: Fidel Levy on: 07/29/2016 11:16 AM   Modules accepted: Orders

## 2016-08-13 DIAGNOSIS — E559 Vitamin D deficiency, unspecified: Secondary | ICD-10-CM | POA: Diagnosis not present

## 2016-08-13 DIAGNOSIS — I10 Essential (primary) hypertension: Secondary | ICD-10-CM | POA: Diagnosis not present

## 2016-08-13 DIAGNOSIS — E789 Disorder of lipoprotein metabolism, unspecified: Secondary | ICD-10-CM | POA: Diagnosis not present

## 2016-08-13 DIAGNOSIS — N390 Urinary tract infection, site not specified: Secondary | ICD-10-CM | POA: Diagnosis not present

## 2016-08-20 DIAGNOSIS — N390 Urinary tract infection, site not specified: Secondary | ICD-10-CM | POA: Diagnosis not present

## 2016-08-20 DIAGNOSIS — R079 Chest pain, unspecified: Secondary | ICD-10-CM | POA: Diagnosis not present

## 2016-08-20 DIAGNOSIS — I10 Essential (primary) hypertension: Secondary | ICD-10-CM | POA: Diagnosis not present

## 2016-08-20 DIAGNOSIS — M546 Pain in thoracic spine: Secondary | ICD-10-CM | POA: Diagnosis not present

## 2016-08-20 DIAGNOSIS — M549 Dorsalgia, unspecified: Secondary | ICD-10-CM | POA: Diagnosis not present

## 2016-08-26 ENCOUNTER — Other Ambulatory Visit: Payer: Self-pay | Admitting: Internal Medicine

## 2016-08-26 NOTE — Telephone Encounter (Signed)
Rx(s) sent to pharmacy electronically.  

## 2016-10-06 IMAGING — CT CT HEAD W/O CM
4 of 6 series · 17 of 47 positions shown, 19 images · non-contrast
Comparison: None currently available

CLINICAL DATA: Fall with headaches and syncope. Neck pain and
limited range of motion.

EXAM:
CT HEAD WITHOUT CONTRAST
CT CERVICAL SPINE WITHOUT CONTRAST
TECHNIQUE: Multidetector CT imaging of the head and cervical spine was
performed following the standard protocol without intravenous
contrast. Multiplanar CT image reconstructions of the cervical spine
were also generated.

[Series 302: soft tissue, idose (2) · axial · 0.39mm/px · z∈[+135,+285]mm · 8 of 97 slices shown, 10 images]
[im 11/97  brain]
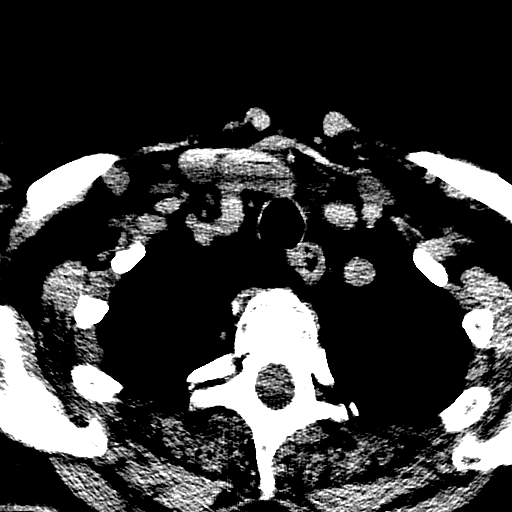
[im 11/97  bone]
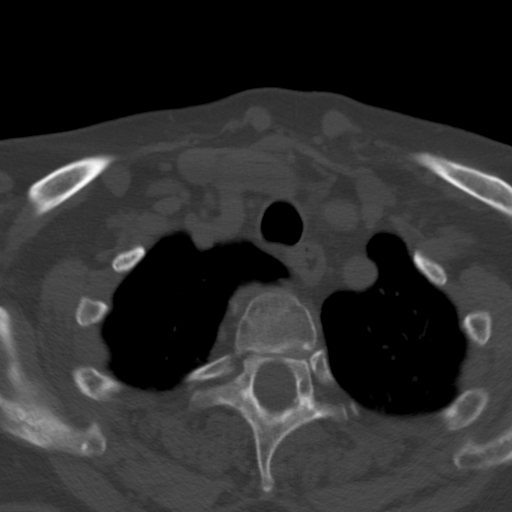
[im 22/97  brain]
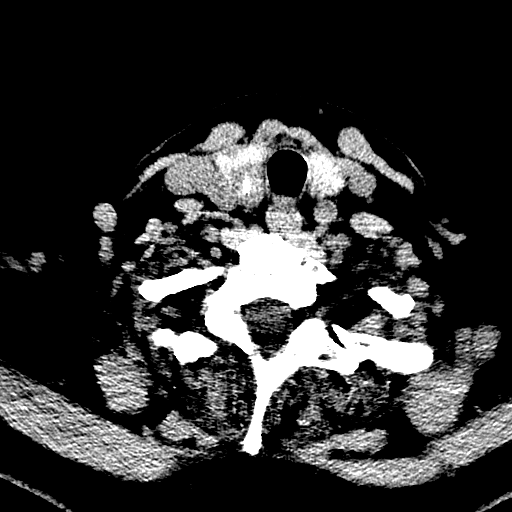
[im 33/97  brain]
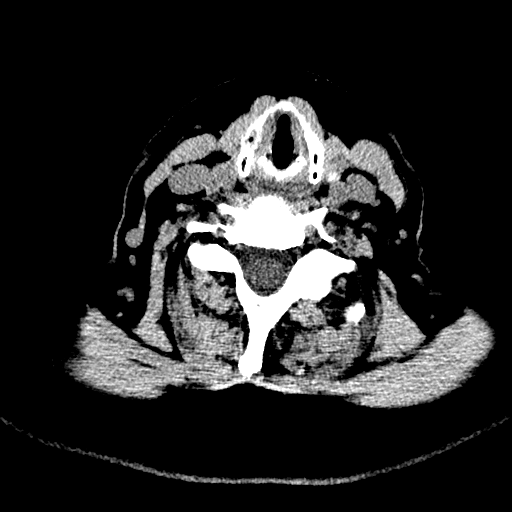
[im 43/97  brain]
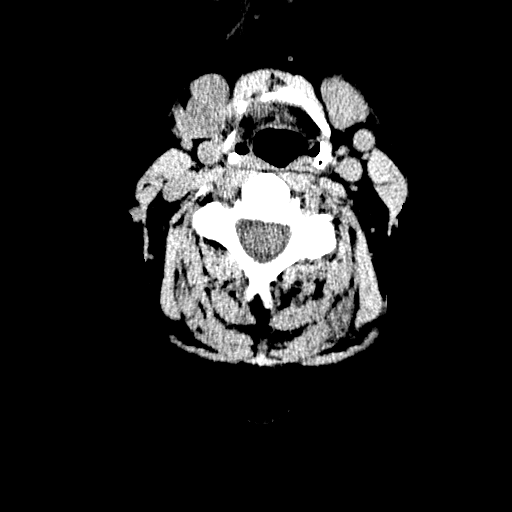
[im 54/97  brain]
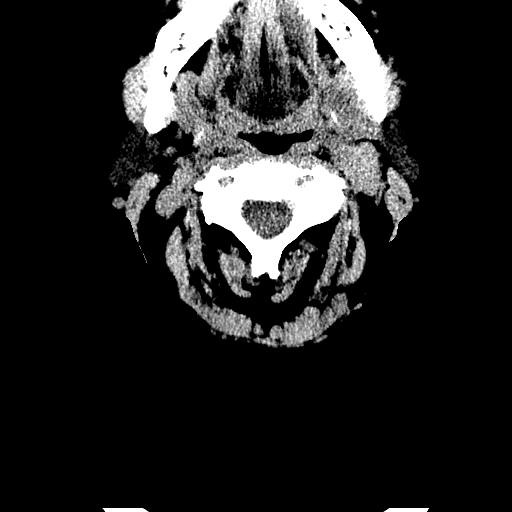
[im 54/97  bone]
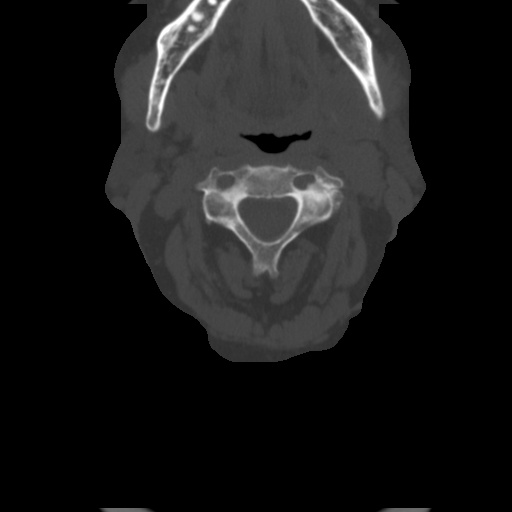
[im 65/97  brain]
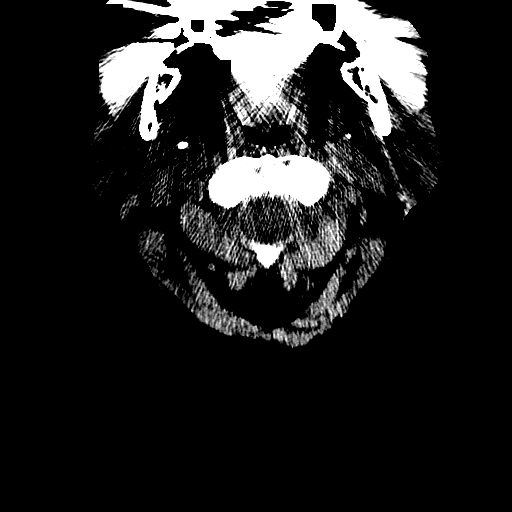
[im 75/97  brain]
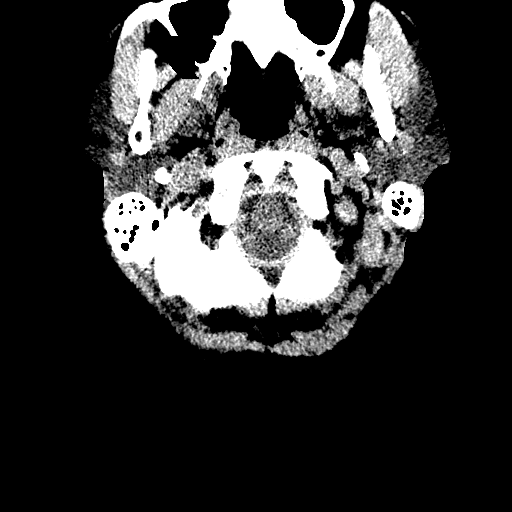
[im 86/97  brain]
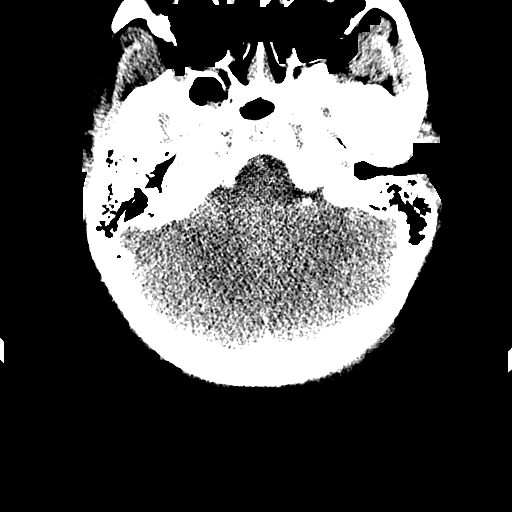

[Series 304: coronal, idose (2) · coronal · 0.36mm/px · 3 of 100 slices shown]
[im 34/100  brain]
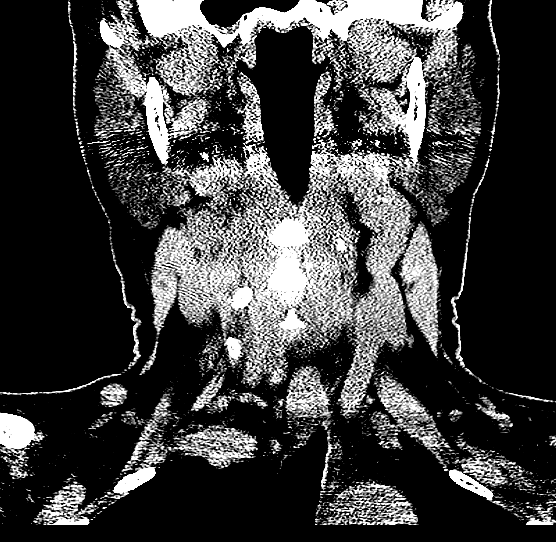
[im 45/100  brain]
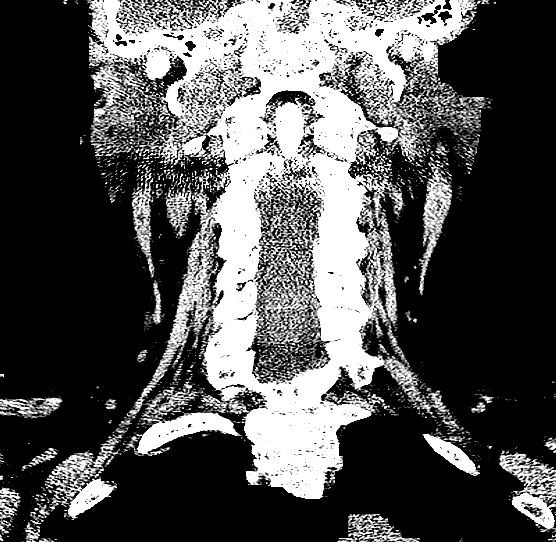
[im 56/100  brain]
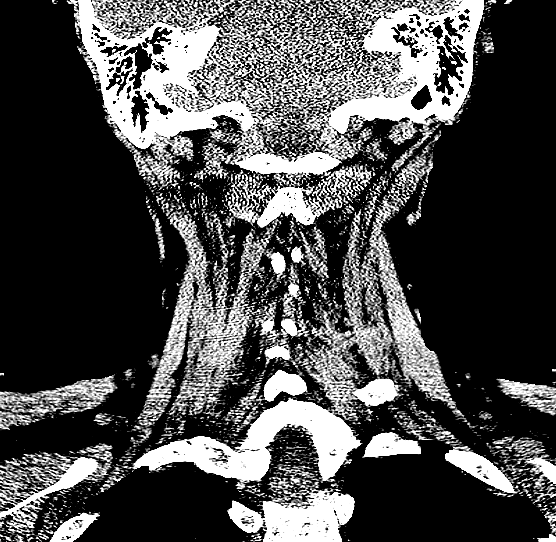

[Series 305: sagittal, idose (2) · sagittal · 0.34mm/px · 3 of 100 slices shown]
[im 34/100  brain]
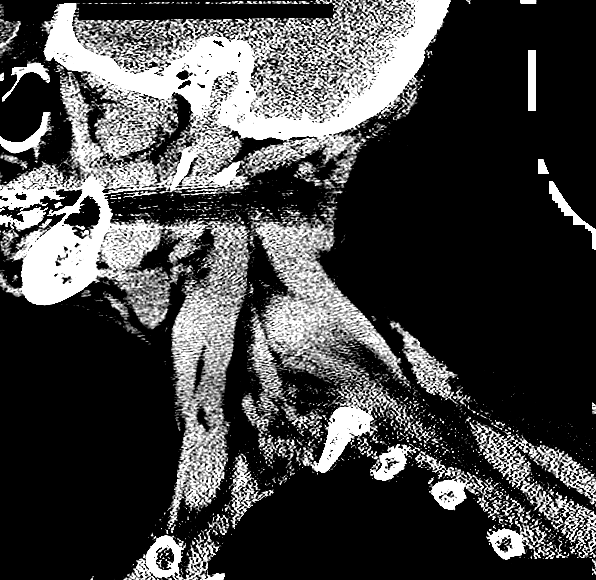
[im 50/100  brain]
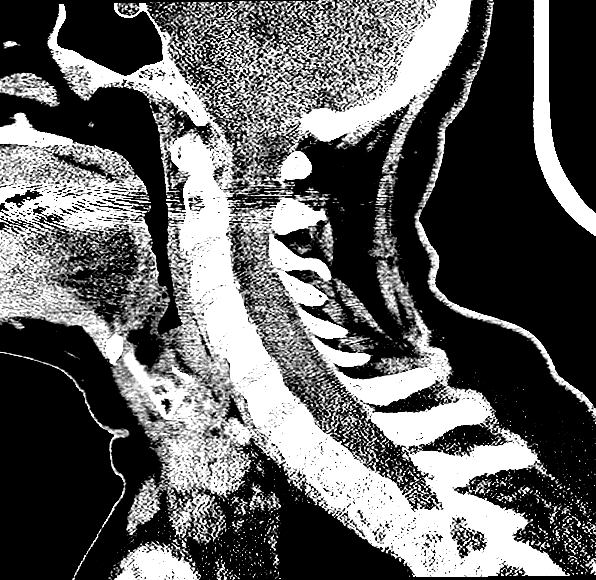
[im 67/100  brain]
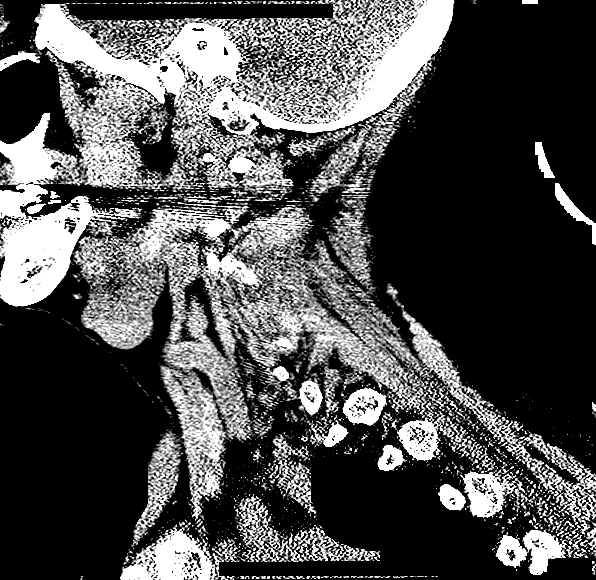

[Series 306: orthogonals, idose (2) · axial · 0.50mm/px · z∈[+115,+158]mm · 3 of 97 slices shown]
[im 11/97  brain]
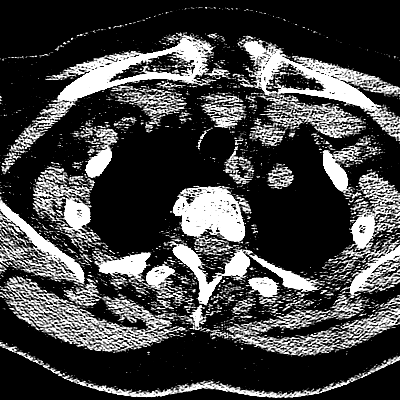
[im 22/97  brain]
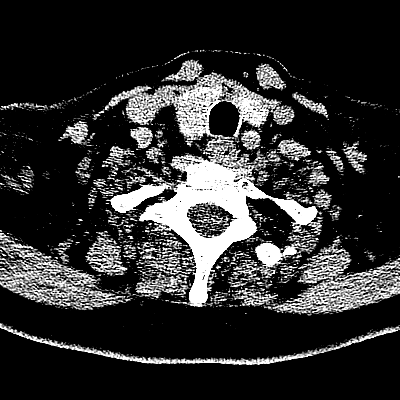
[im 33/97  brain]
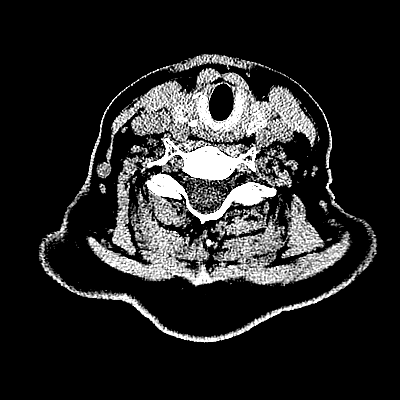

[17 of 47 positions shown; findings below may reference images not displayed]

FINDINGS: CT HEAD FINDINGS

Skull and Sinuses:No acute fracture.

Mild chronic mastoiditis in the right mastoid tip. Chronic sinusitis
in the lateral recess left sphenoid.

Orbits: No acute abnormality.

Brain: No evidence of acute infarction, hemorrhage, hydrocephalus,
or mass lesion/mass effect. There is patchy bilateral cerebral white
matter low density, especially about the ventricles and in the deep
white matter tracts. Appearance is most consistent with chronic
small vessel disease in this patient with history of hypertension
and hyperlipidemia.

CT CERVICAL SPINE FINDINGS

Negative for acute fracture or subluxation. No prevertebral edema.
No gross cervical canal hematoma.

Notable facet arthropathy on the left at C3-4 with spurs narrowing
the foramen.

There is a predominantly calcified nodule within the right thyroid
gland which is chronic based on previous chest CT imaging.
IMPRESSION: No evidence of acute intracranial or cervical spine injury.

## 2016-10-13 DIAGNOSIS — H2513 Age-related nuclear cataract, bilateral: Secondary | ICD-10-CM | POA: Diagnosis not present

## 2016-10-13 DIAGNOSIS — H52223 Regular astigmatism, bilateral: Secondary | ICD-10-CM | POA: Diagnosis not present

## 2016-10-13 DIAGNOSIS — H524 Presbyopia: Secondary | ICD-10-CM | POA: Diagnosis not present

## 2016-10-13 DIAGNOSIS — I10 Essential (primary) hypertension: Secondary | ICD-10-CM | POA: Diagnosis not present

## 2016-10-13 DIAGNOSIS — H5213 Myopia, bilateral: Secondary | ICD-10-CM | POA: Diagnosis not present

## 2016-12-15 DIAGNOSIS — E789 Disorder of lipoprotein metabolism, unspecified: Secondary | ICD-10-CM | POA: Diagnosis not present

## 2016-12-15 DIAGNOSIS — E559 Vitamin D deficiency, unspecified: Secondary | ICD-10-CM | POA: Diagnosis not present

## 2016-12-15 DIAGNOSIS — I10 Essential (primary) hypertension: Secondary | ICD-10-CM | POA: Diagnosis not present

## 2017-01-05 DIAGNOSIS — E789 Disorder of lipoprotein metabolism, unspecified: Secondary | ICD-10-CM | POA: Diagnosis not present

## 2017-01-05 DIAGNOSIS — I10 Essential (primary) hypertension: Secondary | ICD-10-CM | POA: Diagnosis not present

## 2017-03-24 DIAGNOSIS — H6123 Impacted cerumen, bilateral: Secondary | ICD-10-CM | POA: Diagnosis not present

## 2017-04-05 ENCOUNTER — Other Ambulatory Visit: Payer: Self-pay | Admitting: Internal Medicine

## 2017-04-05 NOTE — Telephone Encounter (Signed)
Rx has been sent to the pharmacy electronically. ° °

## 2017-05-24 ENCOUNTER — Other Ambulatory Visit: Payer: Self-pay | Admitting: Certified Nurse Midwife

## 2017-05-24 DIAGNOSIS — Z1231 Encounter for screening mammogram for malignant neoplasm of breast: Secondary | ICD-10-CM

## 2017-06-07 DIAGNOSIS — L821 Other seborrheic keratosis: Secondary | ICD-10-CM | POA: Diagnosis not present

## 2017-06-07 DIAGNOSIS — L820 Inflamed seborrheic keratosis: Secondary | ICD-10-CM | POA: Diagnosis not present

## 2017-06-07 DIAGNOSIS — D225 Melanocytic nevi of trunk: Secondary | ICD-10-CM | POA: Diagnosis not present

## 2017-06-07 DIAGNOSIS — L814 Other melanin hyperpigmentation: Secondary | ICD-10-CM | POA: Diagnosis not present

## 2017-06-07 DIAGNOSIS — Z85820 Personal history of malignant melanoma of skin: Secondary | ICD-10-CM | POA: Diagnosis not present

## 2017-06-22 ENCOUNTER — Ambulatory Visit
Admission: RE | Admit: 2017-06-22 | Discharge: 2017-06-22 | Disposition: A | Discharge status: Home / Self Care | Payer: Medicare Other | Source: Ambulatory Visit | Attending: Certified Nurse Midwife | Admitting: Certified Nurse Midwife

## 2017-06-22 DIAGNOSIS — Z1231 Encounter for screening mammogram for malignant neoplasm of breast: Secondary | ICD-10-CM

## 2017-07-28 ENCOUNTER — Ambulatory Visit (INDEPENDENT_AMBULATORY_CARE_PROVIDER_SITE_OTHER): Payer: Medicare Other | Admitting: Internal Medicine

## 2017-07-28 ENCOUNTER — Encounter: Payer: Self-pay | Admitting: Internal Medicine

## 2017-07-28 ENCOUNTER — Encounter (INDEPENDENT_AMBULATORY_CARE_PROVIDER_SITE_OTHER): Payer: Self-pay

## 2017-07-28 VITALS — BP 100/58 | HR 67 | Ht 68.0 in | Wt 166.0 lb

## 2017-07-28 DIAGNOSIS — I10 Essential (primary) hypertension: Secondary | ICD-10-CM

## 2017-07-28 DIAGNOSIS — E782 Mixed hyperlipidemia: Secondary | ICD-10-CM

## 2017-07-28 DIAGNOSIS — I493 Ventricular premature depolarization: Secondary | ICD-10-CM | POA: Insufficient documentation

## 2017-07-28 DIAGNOSIS — I200 Unstable angina: Secondary | ICD-10-CM
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
## 2017-07-28 NOTE — Patient Instructions (Signed)
Medication Instructions:   Your physician recommends that you continue on your current medications as directed. Please refer to the Current Medication list given to you today.  Please check your BP no more than twice daily, after you have been sitting/resting for 5-10 minutes, at least 1 hour after taking your BP medications  Labwork:  NONE  Testing/Procedures:  Your physician has requested that you have a lexiscan myoview. For further information please visit [email or web address]. Please follow instruction sheet, as given.   Follow-Up:  Your physician recommends that you schedule a follow-up appointment with Dr. Debara Pickett after your test.   If you need a refill on your cardiac medications before your next appointment, please call your pharmacy.  Any Other Special Instructions Will Be Listed Below (If Applicable).

## 2017-07-29 ENCOUNTER — Telehealth (HOSPITAL_COMMUNITY): Payer: Self-pay

## 2017-07-29 NOTE — Telephone Encounter (Signed)
Encounter complete. 

## 2017-07-30 ENCOUNTER — Telehealth (HOSPITAL_COMMUNITY): Payer: Self-pay

## 2017-07-30 NOTE — Telephone Encounter (Signed)
Encounter complete. 

## 2017-08-03 ENCOUNTER — Ambulatory Visit (HOSPITAL_COMMUNITY)
Admission: RE | Admit: 2017-08-03 | Discharge: 2017-08-03 | Disposition: A | Discharge status: Home / Self Care | Payer: Medicare Other | Source: Ambulatory Visit | Attending: Cardiovascular Disease | Admitting: Cardiovascular Disease

## 2017-08-03 DIAGNOSIS — R5383 Other fatigue: Secondary | ICD-10-CM | POA: Insufficient documentation

## 2017-08-03 DIAGNOSIS — I10 Essential (primary) hypertension: Secondary | ICD-10-CM | POA: Diagnosis not present

## 2017-08-03 DIAGNOSIS — I493 Ventricular premature depolarization: Secondary | ICD-10-CM

## 2017-08-03 DIAGNOSIS — I25110 Atherosclerotic heart disease of native coronary artery with unstable angina pectoris: Secondary | ICD-10-CM | POA: Insufficient documentation

## 2017-08-03 DIAGNOSIS — Z8249 Family history of ischemic heart disease and other diseases of the circulatory system: Secondary | ICD-10-CM | POA: Insufficient documentation

## 2017-08-03 DIAGNOSIS — R42 Dizziness and giddiness: Secondary | ICD-10-CM | POA: Insufficient documentation

## 2017-08-03 DIAGNOSIS — I200 Unstable angina: Secondary | ICD-10-CM | POA: Diagnosis not present

## 2017-08-03 LAB — MYOCARDIAL PERFUSION IMAGING
LV dias vol: 128 mL (ref 46–106)
LV sys vol: 62 mL
Peak HR: 81 {beats}/min
Rest HR: 62 {beats}/min
SDS: 1
SRS: 1
SSS: 2
TID: 1.08

## 2017-08-03 MED ORDER — REGADENOSON 0.4 MG/5ML IV SOLN
0.4000 mg | Freq: Once | INTRAVENOUS | Status: AC
  Administered 2017-08-03: 0.4 mg via INTRAVENOUS

## 2017-08-03 MED ORDER — TECHNETIUM TC 99M TETROFOSMIN IV KIT
31.1000 | PACK | Freq: Once | INTRAVENOUS | Status: AC | PRN
Start: 2017-08-03 — End: 2017-08-03
  Administered 2017-08-03: 31.1 via INTRAVENOUS
  Filled 2017-08-03: qty 32

## 2017-08-03 MED ORDER — TECHNETIUM TC 99M TETROFOSMIN IV KIT
11.0000 | PACK | Freq: Once | INTRAVENOUS | Status: AC | PRN
  Administered 2017-08-03: 11 via INTRAVENOUS
  Filled 2017-08-03: qty 11

## 2017-09-14 DIAGNOSIS — F418 Other specified anxiety disorders: Secondary | ICD-10-CM | POA: Diagnosis not present

## 2017-09-14 DIAGNOSIS — E559 Vitamin D deficiency, unspecified: Secondary | ICD-10-CM | POA: Diagnosis not present

## 2017-09-14 DIAGNOSIS — I2510 Atherosclerotic heart disease of native coronary artery without angina pectoris: Secondary | ICD-10-CM | POA: Diagnosis not present

## 2017-09-14 DIAGNOSIS — E7800 Pure hypercholesterolemia, unspecified: Secondary | ICD-10-CM | POA: Diagnosis not present

## 2017-09-14 DIAGNOSIS — Z86718 Personal history of other venous thrombosis and embolism: Secondary | ICD-10-CM | POA: Diagnosis not present

## 2017-09-14 DIAGNOSIS — Z7901 Long term (current) use of anticoagulants: Secondary | ICD-10-CM | POA: Diagnosis not present

## 2017-09-14 DIAGNOSIS — I10 Essential (primary) hypertension: Secondary | ICD-10-CM | POA: Diagnosis not present

## 2017-09-16 DIAGNOSIS — E7800 Pure hypercholesterolemia, unspecified: Secondary | ICD-10-CM | POA: Diagnosis not present

## 2017-09-16 DIAGNOSIS — N390 Urinary tract infection, site not specified: Secondary | ICD-10-CM | POA: Diagnosis not present

## 2017-09-16 DIAGNOSIS — E559 Vitamin D deficiency, unspecified: Secondary | ICD-10-CM | POA: Diagnosis not present

## 2017-09-16 DIAGNOSIS — I10 Essential (primary) hypertension: Secondary | ICD-10-CM | POA: Diagnosis not present

## 2017-09-24 ENCOUNTER — Encounter: Payer: Self-pay | Admitting: Internal Medicine

## 2017-09-24 ENCOUNTER — Ambulatory Visit (INDEPENDENT_AMBULATORY_CARE_PROVIDER_SITE_OTHER): Payer: Medicare Other | Admitting: Internal Medicine

## 2017-09-24 VITALS — BP 108/66 | HR 56 | Ht 65.0 in | Wt 166.0 lb

## 2017-09-24 DIAGNOSIS — I493 Ventricular premature depolarization: Secondary | ICD-10-CM | POA: Diagnosis not present

## 2017-09-24 DIAGNOSIS — I10 Essential (primary) hypertension: Secondary | ICD-10-CM

## 2017-09-24 DIAGNOSIS — I25810 Atherosclerosis of coronary artery bypass graft(s) without angina pectoris: Secondary | ICD-10-CM

## 2017-09-24 DIAGNOSIS — I200 Unstable angina: Secondary | ICD-10-CM
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
## 2017-09-24 NOTE — Patient Instructions (Addendum)
Dr. Debara Pickett has requested that you schedule an appointment with one of our clinical pharmacists for a blood pressure check appointment within the next 1-2 weeks.  -- if you monitor your blood pressure (BP) at home, please bring your BP cuff and your BP readings with you to this appointment   Your physician wants you to follow-up in: ONE YEAR with Dr. Debara Pickett. You will receive a reminder letter in the mail two months in advance. If you don't receive a letter, please call our office to schedule the follow-up appointment.

## 2017-09-25 ENCOUNTER — Other Ambulatory Visit: Payer: Self-pay | Admitting: Cardiology

## 2017-10-06 ENCOUNTER — Encounter: Payer: Self-pay | Admitting: Pharmacist

## 2017-10-06 ENCOUNTER — Ambulatory Visit (INDEPENDENT_AMBULATORY_CARE_PROVIDER_SITE_OTHER): Payer: Medicare Other | Admitting: Pharmacist

## 2017-10-06 VITALS — BP 118/60 | HR 56

## 2017-10-06 DIAGNOSIS — I200 Unstable angina: Secondary | ICD-10-CM | POA: Diagnosis not present

## 2017-10-06 DIAGNOSIS — I10 Essential (primary) hypertension: Secondary | ICD-10-CM

## 2017-10-06 NOTE — Assessment & Plan Note (Addendum)
Patient's blood pressure in the clinic today was within goal at 118/60 mmHg. However, she reports her home readings have been elevated ranging form 110-211 mmHg systolic. Her home blood pressure cuff was checked against the clinic's BP cuff today and it is accurate.  Will move hydrochlorothiazide to morning meds and irbesartan to evening meds to determine any BP trends or if medications are lasting throughout the day. Will taper the clonidine at the next office visit and potentially add amlodipine or switch hctz to chlorthalidone. Provided patient with a BP log to record morning and evening BP readings. Educated patient on the importance of a healthy low sodium diet. Follow up with blood pressure clinic in 2 weeks.

## 2017-10-06 NOTE — Progress Notes (Signed)
Patient ID: Katie Collins                 DOB: 15-Mar-1946                      MRN: [id number]     HPI: Katie Collins is a 72 y.o. female referred by Dr. Debara Pickett to HTN clinic. PMH is significant for hypertension, CAD (PCI 1999), DVT, unstable angina, and PVCs. She presents to [place] today for blood pressure follow-up and to verify the accuracy of her home blood pressure cuff. During her last office visit with Dr. Debara Pickett, BP was 108/66 mmHg. Patient denies any chest pain, SOB, dizziness, swelling in ankle/feet. Overall, patient is feeling well today with no complaints.  Current HTN meds:   Hydrochlorothiazide 25 mg daily  Irbesartan 300 mg daily  Metoprolol succinate 50 mg- 1/2 tablet daily (25 mg)  Clonidine 0.2 mg daily   Previously tried:   Clonidine 0.1mg   Valsartan 320 mg   BP goal: 130/80 mmHg  Family History:   Father had a history of hypertension, heart disease, hyperlipidemia, and cancer  Mother had a history of an aneurysm   Brother has a history of hypertension  Social History:   Denies tobacco use. Patient drinks beer anywhere from daily to a few times a week.   She doesn't drink any sodas and has decreased her coffee intake from 5-6 cups/day to ~3 cups/day.   Diet:   Patient cooks most meals at home eating out occasionally. She does not eat fast food.  She eats a wide variety of vegetables cooking them mostly fresh. Patient does not eat a lot of fruit.  She also cooks with salt and does add some salt to her table food in moderation.   Exercise:   Walks regularly and helps care for 3 of her grandchildren.   Home BP readings:   Patient reports her home blood pressure readings have consistently been in the 130-150/60-80 mmHg.   She takes her blood pressure once a day generally after she takes her morning BP medications.   Wt Readings from Last 3 Encounters:  09/24/17 166 lb (75.3 kg)  08/03/17 166 lb (75.3 kg)  07/28/17 166 lb (75.3 kg)   BP Readings from Last 3  Encounters:  10/06/17 118/60  09/24/17 108/66  07/28/17 (!) 100/58   Pulse Readings from Last 3 Encounters:  10/06/17 (!) 56  09/24/17 (!) 56  07/28/17 67    Past Medical History:  Diagnosis Date  . Anxiety   . CAD (coronary artery disease)   . Deep vein thrombosis (DVT) ([place]) 1987   REMOTE H/O; PT ALSO HAD 03/2012 A SOLEAL VEIN DVT LEFT LEG DUE TO FALL; WAS THEN STARTED ON XARELTO 04/22/12  . Depression   . Endometriosis   . Fibroid   . Hyperlipidemia   . Hypertension   . Melanoma [facility])     Current Outpatient Medications on File Prior to Visit  Medication Sig Dispense Refill  . cloNIDine (CATAPRES) 0.2 MG tablet   5  . hydrochlorothiazide (HYDRODIURIL) 25 MG tablet TAKE 1 TABLET BY MOUTH EVERY DAY 30 tablet 6  . irbesartan (AVAPRO) 300 MG tablet Take 1 tablet by mouth daily.  11  . metoprolol succinate (TOPROL-XL) 50 MG 24 hr tablet Take 25 mg by mouth daily.   12  . acyclovir (ZOVIRAX) 800 MG tablet as needed.    Marland Kitchen aspirin 325 MG EC tablet Take 325 mg by mouth  daily.      . atorvastatin (LIPITOR) 40 MG tablet Take 40 mg by mouth daily.      Marland Kitchen CALCIUM PO Take by mouth daily.    . Cholecalciferol (VITAMIN D PO) Take 10,000 Int'l Units by mouth once a week.    . escitalopram (LEXAPRO) 10 MG tablet TAKE 1 TABLET BY MOUTH EVERY DAY 30 tablet 1  . ezetimibe (ZETIA) 10 MG tablet Take 10 mg by mouth daily.      . Multiple Vitamins-Minerals (MULTIVITAMIN PO) Take by mouth daily.     No current facility-administered medications on file prior to visit.     No Known Allergies  Essential hypertension Patient's blood pressure in the clinic today was within goal at 118/60 mmHg. However, she reports her home readings have been elevated ranging form 093-235 mmHg systolic. Her home blood pressure cuff was checked against the clinic's BP cuff today and it is accurate.  Will move hydrochlorothiazide to morning meds and irbesartan to evening meds to determine any BP trends or if  medications are lasting throughout the day. Will taper the clonidine at the next office visit and potentially add amlodipine or switch hctz to chlorthalidone. Provided patient with a BP log to record morning and evening BP readings. Educated patient on the importance of a healthy low sodium diet. Follow up with blood pressure clinic in 2 weeks.   Note written by: Jerilynn Mages, PharmD Candidate.  Present during OV. Assessment and plan discussed and approved by: Osker Ayoub Rodriguez-Guzman PharmD, BCPS, [place] [address] 10/06/2017 1:03 PM

## 2017-10-06 NOTE — Patient Instructions (Addendum)
Return for a a follow up appointment in 2 weeks 10/21/17.   Your blood pressure today is 118/60 mmHg  Check your blood pressure at home daily (if able) and keep record of the readings.  Take your BP meds as follows:  Morning Meds:   Hydrochlorothiazide 25 mg daily   Metoprolol succinate 25mg  daily    Clonidine 0.2 mg daily  Evening Meds:   Irbesartan 300 mg daily   Bring all of your meds and home blood pressures to your next appointment.  Exercise as you're able, try to walk approximately 30 minutes per day.  Keep salt intake to a minimum, especially watch canned and prepared boxed foods.  Eat more fresh fruits and vegetables and fewer canned items.  Avoid eating in fast food restaurants.    HOW TO TAKE YOUR BLOOD PRESSURE: . Rest 5 minutes before taking your blood pressure. .  Don't smoke or drink caffeinated beverages for at least 30 minutes before. . Take your blood pressure before (not after) you eat. . Sit comfortably with your back supported and both feet on the floor (don't cross your legs). . Elevate your arm to heart level on a table or a desk. . Use the proper sized cuff. It should fit smoothly and snugly around your bare upper arm. There should be enough room to slip a fingertip under the cuff. The bottom edge of the cuff should be 1 inch above the crease of the elbow. . Ideally, take 3 measurements at one sitting and record the average.

## 2017-10-21 ENCOUNTER — Ambulatory Visit (INDEPENDENT_AMBULATORY_CARE_PROVIDER_SITE_OTHER): Payer: Medicare Other | Admitting: Pharmacist

## 2017-10-21 VITALS — BP 102/60 | HR 45 | Ht 68.0 in | Wt 166.0 lb

## 2017-10-21 DIAGNOSIS — I10 Essential (primary) hypertension: Secondary | ICD-10-CM

## 2017-10-21 DIAGNOSIS — I200 Unstable angina: Secondary | ICD-10-CM | POA: Diagnosis not present

## 2017-10-21 MED ORDER — METOPROLOL SUCCINATE ER 25 MG PO TB24: 12.5000 mg | ORAL_TABLET | Freq: Every day | ORAL | 1 refills | Status: DC

## 2017-10-21 MED ORDER — AMLODIPINE BESYLATE 5 MG PO TABS: 5.0000 mg | ORAL_TABLET | Freq: Every day | ORAL | 1 refills | Status: DC

## 2017-10-21 NOTE — Patient Instructions (Addendum)
Return for a follow up appointment in 4 weeks  Check your blood pressure at home daily (if able) and keep record of the readings.  Take your BP meds as follows: *DECREASE clonidine to 1/2 tablet for 7 days, then decrease to 1/2 tablet every other day for 7 days, then STOP* *START taking amlodipine 5mg  daily on 10/28/17* *DECREASE metoprolol to 12.5mg  daily*  Bring all of your meds, your BP cuff and your record of home blood pressures to your next appointment.  Exercise as you're able, try to walk approximately 30 minutes per day.  Keep salt intake to a minimum, especially watch canned and prepared boxed foods.  Eat more fresh fruits and vegetables and fewer canned items.  Avoid eating in fast food restaurants.    HOW TO TAKE YOUR BLOOD PRESSURE: . Rest 5 minutes before taking your blood pressure. .  Don't smoke or drink caffeinated beverages for at least 30 minutes before. . Take your blood pressure before (not after) you eat. . Sit comfortably with your back supported and both feet on the floor (don't cross your legs). . Elevate your arm to heart level on a table or a desk. . Use the proper sized cuff. It should fit smoothly and snugly around your bare upper arm. There should be enough room to slip a fingertip under the cuff. The bottom edge of the cuff should be 1 inch above the crease of the elbow. . Ideally, take 3 measurements at one sitting and record the average.

## 2017-10-21 NOTE — Progress Notes (Signed)
Patient ID: YEIMI DEBNAM                 DOB: 1945/10/23                      MRN: [id number]     HPI: Katie Collins is a 72 y.o. female referred by Dr. Debara Pickett to HTN clinic. PMH is significant for hypertension, CAD (PCI 1999), DVT, unstable angina, and PVCs. She presents to [place] today for blood pressure follow up and denies any chest pain, SOB, dizziness, swelling in ankle/feet. Overall, patient is feeling well today with no complaints.   Current HTN meds:   Hydrochlorothiazide 25 mg in AM  Irbesartan 300 mg in PM  Metoprolol succinate 50 mg- 1/2 tablet daily (25 mg) in AM  Clonidine 0.2 mg daily in AM  Previously tried:   Clonidine 0.1mg   Valsartan 320 mg   BP goal: 130/80 mmHg  Family History:   Father had a history of hypertension, heart disease, hyperlipidemia, and cancer  Mother had a history of an aneurysm   Brother has a history of hypertension  Social History:   Denies tobacco use. Patient drinks beer anywhere from daily to a few times a week.   She doesn't drink any sodas and has decreased her coffee intake from 5-6 cups/day to ~3 cups/day.   Diet:   Patient cooks most meals at home eating out occasionally. She does not eat fast food.  She eats a wide variety of vegetables cooking them mostly fresh. Patient does not eat a lot of fruit.  She also cooks with salt and does add some salt to her table food in moderation.   Exercise:   Walks regularly and helps care for 3 of her grandchildren.   Home BP readings:   12 morning readings; average 146/72 (pulse 44-60 bpm)  11 evening readings; average 138/68 (pulse 50-65 bpm)  Wt Readings from Last 3 Encounters:  10/21/17 166 lb (75.3 kg)  09/24/17 166 lb (75.3 kg)  08/03/17 166 lb (75.3 kg)   BP Readings from Last 3 Encounters:  10/21/17 102/60  10/06/17 118/60  09/24/17 108/66   Pulse Readings from Last 3 Encounters:  10/21/17 (!) 45  10/06/17 (!) 56  09/24/17 (!) 56    Past Medical History:  Diagnosis  Date  . Anxiety   . CAD (coronary artery disease)   . Deep vein thrombosis (DVT) ([place]) 1987   REMOTE H/O; PT ALSO HAD 03/2012 A SOLEAL VEIN DVT LEFT LEG DUE TO FALL; WAS THEN STARTED ON XARELTO 04/22/12  . Depression   . Endometriosis   . Fibroid   . Hyperlipidemia   . Hypertension   . Melanoma [facility])     Current Outpatient Medications on File Prior to Visit  Medication Sig Dispense Refill  . acyclovir (ZOVIRAX) 800 MG tablet as needed.    Marland Kitchen aspirin 325 MG EC tablet Take 325 mg by mouth daily.      Marland Kitchen atorvastatin (LIPITOR) 40 MG tablet Take 40 mg by mouth daily.      Marland Kitchen CALCIUM PO Take by mouth daily.    . Cholecalciferol (VITAMIN D PO) Take 10,000 Int'l Units by mouth once a week.    . cloNIDine (CATAPRES) 0.2 MG tablet Take 0.1mg  daily x 7 days, then 0.1mg  every other day for 7 days, then STOP  5  . escitalopram (LEXAPRO) 10 MG tablet TAKE 1 TABLET BY MOUTH EVERY DAY 30 tablet 1  .  ezetimibe (ZETIA) 10 MG tablet Take 10 mg by mouth daily.      . hydrochlorothiazide (HYDRODIURIL) 25 MG tablet TAKE 1 TABLET BY MOUTH EVERY DAY 30 tablet 6  . irbesartan (AVAPRO) 300 MG tablet Take 1 tablet by mouth daily.  11  . Multiple Vitamins-Minerals (MULTIVITAMIN PO) Take by mouth daily.     No current facility-administered medications on file prior to visit.     No Known Allergies  Essential hypertension Blood pressure iswell controlled today but continues to be elevated at home. Also noted patient HR remains in 40-50s bpm. Will wean-off clonidine, decrease metoprolol succinate to 12.5mg  daily, and initiate amlodipine 5mg  daily in the morning. Plant to follow up in 4 weeks and change HCTZ to chlorthalidone if additional BP control needed.   Katie Collins PharmD, BCPS, [place] [address] 10/24/2017 5:13 PM

## 2017-10-24 ENCOUNTER — Encounter: Payer: Self-pay | Admitting: Pharmacist

## 2017-10-24 NOTE — Assessment & Plan Note (Signed)
Blood pressure iswell controlled today but continues to be elevated at home. Also noted patient HR remains in 40-50s bpm. Will wean-off clonidine, decrease metoprolol succinate to 12.5mg  daily, and initiate amlodipine 5mg  daily in the morning. Plant to follow up in 4 weeks and change HCTZ to chlorthalidone if additional BP control needed.

## 2017-11-16 ENCOUNTER — Other Ambulatory Visit: Payer: Self-pay | Admitting: Internal Medicine

## 2017-11-23 ENCOUNTER — Ambulatory Visit: Payer: Medicare Other

## 2017-11-24 ENCOUNTER — Other Ambulatory Visit: Payer: Self-pay

## 2017-11-24 MED ORDER — METOPROLOL SUCCINATE ER 25 MG PO TB24: 12.5000 mg | ORAL_TABLET | Freq: Every day | ORAL | 11 refills | Status: AC

## 2017-11-24 MED ORDER — AMLODIPINE BESYLATE 5 MG PO TABS: 5.0000 mg | ORAL_TABLET | Freq: Every day | ORAL | 10 refills | Status: DC

## 2017-11-24 NOTE — Addendum Note (Signed)
Addended by: Diana Eves on: 11/24/2017 03:51 PM   Modules accepted: Orders

## 2017-11-24 NOTE — Telephone Encounter (Signed)
Rx(s) sent to pharmacy electronically.  

## 2017-12-01 ENCOUNTER — Ambulatory Visit (INDEPENDENT_AMBULATORY_CARE_PROVIDER_SITE_OTHER): Payer: Medicare Other | Admitting: Pharmacist Clinician (PhC)/ Clinical Pharmacy Specialist

## 2017-12-01 VITALS — BP 142/82 | HR 50

## 2017-12-01 DIAGNOSIS — I10 Essential (primary) hypertension: Secondary | ICD-10-CM | POA: Diagnosis not present

## 2017-12-01 DIAGNOSIS — I200 Unstable angina: Secondary | ICD-10-CM | POA: Diagnosis not present

## 2017-12-01 MED ORDER — CHLORTHALIDONE 25 MG PO TABS: 25.0000 mg | ORAL_TABLET | Freq: Every day | ORAL | 3 refills | Status: DC

## 2017-12-01 NOTE — Progress Notes (Signed)
Patient ID: Katie Collins                 DOB: 1946/01/21                      MRN: [id number]     HPI: Katie Collins is a 72 y.o. female referred by Dr. Debara Pickett to HTN clinic. PMH is significant for hypertension, CAD (PCI 1999), DVT (provoked), unstable angina, and PVCs.   Over the past several months her blood pressure readings at home have been higher than her in office readings.  The home cuff was checked in the office in March and found to be accurate to within 10 points.   Last month she saw [name] Rodriguez-Guzman, who switched her off clonidine to amlodipine 5 mg, and decreased the metoprolol to 12.5 mg daily (her heart rate was 45 in the office that day).  She has also adjusted time of day for medications, in hopes of getting further control.    She presents to [place] today for blood pressure follow up and denies any chest pain or shortness of breath.  Unfortunately she has been experiencing some swelling in her left foot and calf over the past couple of weeks.  It worsens as the day goes on.  At night she wears TED hose, and by morning there is no swelling at all.  She states that without the TED hose there is still minor swelling in the mornings.  This morning in the office there is no swelling or redness noted.    Current HTN meds:   Hydrochlorothiazide 25 mg in AM  Irbesartan 300 mg in PM  Metoprolol succinate 12.5 mg- 1/2 tablet daily AM  Amlodipine 5 mg in PM  Previously tried:   Clonidine 0.1mg   Valsartan 320 mg   BP goal: 130/80 mmHg  Family History:   Father had a history of hypertension, heart disease, hyperlipidemia, and cancer  Mother had a history of an aneurysm   Brother has a history of hypertension  Social History:   Denies tobacco use. Patient drinks beer anywhere from daily to a few times a week.   She doesn't drink any sodas and has decreased her coffee intake from 5-6 cups/day to ~3 cups/day.   Diet:   Patient cooks most meals at home eating out occasionally.  She does not eat fast food.  She eats a wide variety of vegetables cooking them mostly fresh. Patient does not eat a lot of fruit.  She also cooks with salt and does add some salt to her table food in moderation.   Exercise:   Walks regularly and helps care for 3 of her grandchildren.   Home BP readings:   12 morning readings; average 136/72 (pulse 44-60 bpm) - average is down 10 points systolic from last month  11 evening readings; average 134/71 (pulse 50-65 bpm) - average is down 4 points systolic from last month  Wt Readings from Last 3 Encounters:  10/21/17 166 lb (75.3 kg)  09/24/17 166 lb (75.3 kg)  08/03/17 166 lb (75.3 kg)   BP Readings from Last 3 Encounters:  12/01/17 (!) 142/82  10/21/17 102/60  10/06/17 118/60   Pulse Readings from Last 3 Encounters:  12/01/17 (!) 50  10/21/17 (!) 45  10/06/17 (!) 56    Past Medical History:  Diagnosis Date  . Anxiety   . CAD (coronary artery disease)   . Deep vein thrombosis (DVT) ([place]) 1987   REMOTE  H/O; PT ALSO HAD 03/2012 A SOLEAL VEIN DVT LEFT LEG DUE TO FALL; WAS THEN STARTED ON XARELTO 04/22/12  . Depression   . Endometriosis   . Fibroid   . Hyperlipidemia   . Hypertension   . Melanoma [facility])     Current Outpatient Medications on File Prior to Visit  Medication Sig Dispense Refill  . acyclovir (ZOVIRAX) 800 MG tablet as needed.    Marland Kitchen amLODipine (NORVASC) 5 MG tablet Take 1 tablet (5 mg total) by mouth daily. 30 tablet 10  . aspirin 325 MG EC tablet Take 325 mg by mouth daily.      Marland Kitchen atorvastatin (LIPITOR) 40 MG tablet Take 40 mg by mouth daily.      Marland Kitchen CALCIUM PO Take by mouth daily.    . Cholecalciferol (VITAMIN D PO) Take 10,000 Int'l Units by mouth once a week.    . cloNIDine (CATAPRES) 0.2 MG tablet Take 0.1mg  daily x 7 days, then 0.1mg  every other day for 7 days, then STOP  5  . escitalopram (LEXAPRO) 10 MG tablet TAKE 1 TABLET BY MOUTH EVERY DAY 30 tablet 1  . ezetimibe (ZETIA) 10 MG tablet Take 10 mg by mouth  daily.      . hydrochlorothiazide (HYDRODIURIL) 25 MG tablet TAKE 1 TABLET BY MOUTH EVERY DAY 30 tablet 6  . irbesartan (AVAPRO) 300 MG tablet Take 1 tablet by mouth daily.  11  . metoprolol succinate (TOPROL-XL) 25 MG 24 hr tablet Take 0.5 tablets (12.5 mg total) by mouth daily. 15 tablet 11  . Multiple Vitamins-Minerals (MULTIVITAMIN PO) Take by mouth daily.     No current facility-administered medications on file prior to visit.     No Known Allergies  BP 142/82, standing was 148/86.  HR 50  Essential hypertension Patient with essential hypertension, and an improvement in average home BP readings of 10 points systolic.  Unfortunately the amlodipine may be the cause of her lower extremity edema.  Will have her discontinue that for now.  She will call into the office in a week to let us know if the swelling has gone away.  If so, would like to rechallenge her with the 2.5 mg amlodipine dose to see if she could tolerate that.  Also, I am going to discontinue her hctz and switch her to chlorthalidone 25 mg once daily.  Will have her repeat BMET in 2 weeks to be sure she is stable.  She will return to the office in 1 month for follow up.     Tommy Medal PharmD CPP [facility] [address] [place] [address] 12/01/2017 10:30 AM

## 2017-12-01 NOTE — Patient Instructions (Signed)
Return for a a follow up appointment in 1 month  Your blood pressure today is 142/82  Check your blood pressure at home several days each week and keep record of the readings.  Take your BP meds as follows:  Stop hydrochlorothiazide - start chlorthalidone 25 mg once daily in the mornings  Stop amlodipine.  Call us in about a week to let us know if the ankle swelling has stopped.  If so, we will probably have you restart the amlodipine at 1/2 tablet (2.5 mg) each evening.   Continue with all other medications.  Bring all of your meds, your BP cuff and your record of home blood pressures to your next appointment.  Exercise as you're able, try to walk approximately 30 minutes per day.  Keep salt intake to a minimum, especially watch canned and prepared boxed foods.  Eat more fresh fruits and vegetables and fewer canned items.  Avoid eating in fast food restaurants.    HOW TO TAKE YOUR BLOOD PRESSURE: . Rest 5 minutes before taking your blood pressure. .  Don't smoke or drink caffeinated beverages for at least 30 minutes before. . Take your blood pressure before (not after) you eat. . Sit comfortably with your back supported and both feet on the floor (don't cross your legs). . Elevate your arm to heart level on a table or a desk. . Use the proper sized cuff. It should fit smoothly and snugly around your bare upper arm. There should be enough room to slip a fingertip under the cuff. The bottom edge of the cuff should be 1 inch above the crease of the elbow. . Ideally, take 3 measurements at one sitting and record the average.

## 2017-12-01 NOTE — Assessment & Plan Note (Signed)
Patient with essential hypertension, and an improvement in average home BP readings of 10 points systolic.  Unfortunately the amlodipine may be the cause of her lower extremity edema.  Will have her discontinue that for now.  She will call into the office in a week to let us know if the swelling has gone away.  If so, would like to rechallenge her with the 2.5 mg amlodipine dose to see if she could tolerate that.  Also, I am going to discontinue her hctz and switch her to chlorthalidone 25 mg once daily.  Will have her repeat BMET in 2 weeks to be sure she is stable.  She will return to the office in 1 month for follow up.

## 2017-12-16 DIAGNOSIS — L814 Other melanin hyperpigmentation: Secondary | ICD-10-CM | POA: Diagnosis not present

## 2017-12-16 DIAGNOSIS — L819 Disorder of pigmentation, unspecified: Secondary | ICD-10-CM | POA: Diagnosis not present

## 2017-12-16 DIAGNOSIS — Z85820 Personal history of malignant melanoma of skin: Secondary | ICD-10-CM | POA: Diagnosis not present

## 2017-12-16 DIAGNOSIS — C44519 Basal cell carcinoma of skin of other part of trunk: Secondary | ICD-10-CM | POA: Diagnosis not present

## 2017-12-16 DIAGNOSIS — D485 Neoplasm of uncertain behavior of skin: Secondary | ICD-10-CM | POA: Diagnosis not present

## 2017-12-16 DIAGNOSIS — L821 Other seborrheic keratosis: Secondary | ICD-10-CM | POA: Diagnosis not present

## 2017-12-16 DIAGNOSIS — D1801 Hemangioma of skin and subcutaneous tissue: Secondary | ICD-10-CM | POA: Diagnosis not present

## 2017-12-17 DIAGNOSIS — C44519 Basal cell carcinoma of skin of other part of trunk: Secondary | ICD-10-CM | POA: Diagnosis not present

## 2017-12-20 DIAGNOSIS — I10 Essential (primary) hypertension: Secondary | ICD-10-CM | POA: Diagnosis not present

## 2017-12-21 DIAGNOSIS — H52223 Regular astigmatism, bilateral: Secondary | ICD-10-CM | POA: Diagnosis not present

## 2017-12-21 DIAGNOSIS — H2513 Age-related nuclear cataract, bilateral: Secondary | ICD-10-CM | POA: Diagnosis not present

## 2017-12-21 DIAGNOSIS — H5213 Myopia, bilateral: Secondary | ICD-10-CM | POA: Diagnosis not present

## 2017-12-21 DIAGNOSIS — I10 Essential (primary) hypertension: Secondary | ICD-10-CM | POA: Diagnosis not present

## 2017-12-21 LAB — BASIC METABOLIC PANEL
BUN/Creatinine Ratio: 19 (ref 12–28)
BUN: 13 mg/dL (ref 8–27)
CO2: 27 mmol/L (ref 20–29)
Calcium: 9.8 mg/dL (ref 8.7–10.3)
Chloride: 100 mmol/L (ref 96–106)
Creatinine, Ser: 0.7 mg/dL (ref 0.57–1.00)
GFR calc Af Amer: 101 mL/min/{1.73_m2} (ref >59)
GFR calc non Af Amer: 87 mL/min/{1.73_m2} (ref >59)
Glucose: 82 mg/dL (ref 65–99)
Potassium: 4.2 mmol/L (ref 3.5–5.2)
Sodium: 143 mmol/L (ref 134–144)

## 2017-12-28 ENCOUNTER — Ambulatory Visit (INDEPENDENT_AMBULATORY_CARE_PROVIDER_SITE_OTHER): Payer: Medicare Other | Admitting: Pharmacist Clinician (PhC)/ Clinical Pharmacy Specialist

## 2017-12-28 ENCOUNTER — Encounter: Payer: Self-pay | Admitting: Pharmacist Clinician (PhC)/ Clinical Pharmacy Specialist

## 2017-12-28 DIAGNOSIS — I200 Unstable angina: Secondary | ICD-10-CM

## 2017-12-28 DIAGNOSIS — I10 Essential (primary) hypertension: Secondary | ICD-10-CM

## 2017-12-28 NOTE — Patient Instructions (Addendum)
Call if you have any questions or concerns.  Katie Collins/[name] at [phone]  Your blood pressure today is 136/72  Check your blood pressure at home 2-3 times per week and keep record of the readings.  Take your BP meds as follows:  Chlorothalidone 25 mg in AM  Irbesartan 300 mg in PM  Metoprolol succinate 12.5 mg- 1/2 tablet daily AM  Bring all of your meds, your BP cuff and your record of home blood pressures to your next appointment.  Exercise as you're able, try to walk approximately 30 minutes per day.  Keep salt intake to a minimum, especially watch canned and prepared boxed foods.  Eat more fresh fruits and vegetables and fewer canned items.  Avoid eating in fast food restaurants.    HOW TO TAKE YOUR BLOOD PRESSURE: . Rest 5 minutes before taking your blood pressure. .  Don't smoke or drink caffeinated beverages for at least 30 minutes before. . Take your blood pressure before (not after) you eat. . Sit comfortably with your back supported and both feet on the floor (don't cross your legs). . Elevate your arm to heart level on a table or a desk. . Use the proper sized cuff. It should fit smoothly and snugly around your bare upper arm. There should be enough room to slip a fingertip under the cuff. The bottom edge of the cuff should be 1 inch above the crease of the elbow. . Ideally, take 3 measurements at one sitting and record the average.

## 2017-12-28 NOTE — Progress Notes (Signed)
[name]                 DOB: 09/05/1945                      MRN: [id number]     HPI: Katie Collins is a 72 y.o. female referred by Dr. Debara Pickett to HTN clinic. PMH is significant for hypertension, CAD (PCI 1999), DVT (provoked), unstable angina, and PVCs.   She continues to have higher readings in the office than at home.  The home cuff was checked in the office in March and found to be accurate to within 10 points.   In the past several months we have discontinued clonidine, switching her to amlodipine.  Then when when the amlodipine caused swelling it was also discontinued.  Hydrochlorothiazide was switched out to chlorthalidone for better control    At her last visit her home readings were coming down nicely, with twice daily averages in the 814'G systolic.  Because of the lower extremity edema the amlodipine was discontinued, as was hctz.  She has been on chlorthalidone for about a month now and a BMET, drawn last week, was stable.  She has also noted her pressure has improved.  She has no complaints of chest pain, shortness of breath, dizziness with positional changes or edema.    Current HTN meds:   Chlorothalidone 25 mg in AM  Irbesartan 300 mg in PM  Metoprolol succinate 12.5 mg- 1/2 tablet daily AM   Previously tried:   Clonidine 0.1mg   Valsartan 320 mg   BP goal: 130/80 mmHg  Family History:   Father had a history of hypertension, heart disease, hyperlipidemia, and cancer  Mother had a history of an aneurysm   Brother has a history of hypertension  Social History:   Denies tobacco use. Patient drinks beer anywhere from daily to a few times a week.   She doesn't drink any sodas and has decreased her coffee intake from 5-6 cups/day to ~3 cups/day.   Diet:   Patient cooks most meals at home eating out occasionally. She does not eat fast food.  She eats a wide variety of vegetables cooking them mostly fresh. Patient does not eat a lot of fruit.  She also cooks  with salt and does add some salt to her table food in moderation.   Has recently added smoothies for breakfast (likes celery or beet/cucumber)  Exercise:   Walks regularly and helps care for 3 of her grandchildren.   Home BP readings:   6 morning readings; average 123/70  (pulse 63-70 bpm) - average is down 11 points systolic from last month  4 evening readings; average 127/68 (pulse 50-65 bpm) - average is down 7 points systolic from last month  Wt Readings from Last 3 Encounters:  10/21/17 166 lb (75.3 kg)  09/24/17 166 lb (75.3 kg)  08/03/17 166 lb (75.3 kg)   BP Readings from Last 3 Encounters:  12/28/17 136/72  12/01/17 (!) 142/82  10/21/17 102/60   Pulse Readings from Last 3 Encounters:  12/28/17 (!) 52  12/01/17 (!) 50  10/21/17 (!) 45    Past Medical History:  Diagnosis Date  . Anxiety   . CAD (coronary artery disease)   . Deep vein thrombosis (DVT) ([place]) 1987   REMOTE H/O; PT ALSO HAD 03/2012 A SOLEAL VEIN DVT LEFT LEG DUE TO FALL; WAS THEN STARTED ON XARELTO 04/22/12  . Depression   . Endometriosis   .  Fibroid   . Hyperlipidemia   . Hypertension   . Melanoma [facility])     Current Outpatient Medications on File Prior to Visit  Medication Sig Dispense Refill  . acyclovir (ZOVIRAX) 800 MG tablet as needed.    Marland Kitchen aspirin 325 MG EC tablet Take 325 mg by mouth daily.      Marland Kitchen atorvastatin (LIPITOR) 40 MG tablet Take 40 mg by mouth daily.      Marland Kitchen CALCIUM PO Take by mouth daily.    . chlorthalidone (HYGROTON) 25 MG tablet Take 1 tablet (25 mg total) by mouth daily. 30 tablet 3  . Cholecalciferol (VITAMIN D PO) Take 10,000 Int'l Units by mouth once a week.    . escitalopram (LEXAPRO) 10 MG tablet TAKE 1 TABLET BY MOUTH EVERY DAY 30 tablet 1  . ezetimibe (ZETIA) 10 MG tablet Take 10 mg by mouth daily.      . irbesartan (AVAPRO) 300 MG tablet Take 1 tablet by mouth daily.  11  . metoprolol succinate (TOPROL-XL) 25 MG 24 hr tablet Take 0.5 tablets (12.5 mg total) by mouth  daily. 15 tablet 11  . Multiple Vitamins-Minerals (MULTIVITAMIN PO) Take by mouth daily.     No current facility-administered medications on file prior to visit.     No Known Allergies   Essential hypertension Patient with essential hypertension, now appears to be well controlled at home on current medications.  Her in office readings tend to be 5-10 points higher than home readings, and home cuff known to be accurate at previous CVRR appointment.   She is to continue monitoring home blood pressure 2-3 times per week and keep a written log.  Should she notice a trend to > 130 at home she will call the office.    Tommy Medal PharmD CPP [facility] [address] [place] [address] 12/28/2017 1:17 PM

## 2017-12-28 NOTE — Assessment & Plan Note (Signed)
Patient with essential hypertension, now appears to be well controlled at home on current medications.  Her in office readings tend to be 5-10 points higher than home readings, and home cuff known to be accurate at previous CVRR appointment.   She is to continue monitoring home blood pressure 2-3 times per week and keep a written log.  Should she notice a trend to > 130 at home she will call the office.

## 2018-01-15 DIAGNOSIS — L255 Unspecified contact dermatitis due to plants, except food: Secondary | ICD-10-CM | POA: Diagnosis not present

## 2018-02-18 DIAGNOSIS — Z85828 Personal history of other malignant neoplasm of skin: Secondary | ICD-10-CM | POA: Diagnosis not present

## 2018-02-18 DIAGNOSIS — L905 Scar conditions and fibrosis of skin: Secondary | ICD-10-CM | POA: Diagnosis not present

## 2018-03-02 ENCOUNTER — Other Ambulatory Visit: Payer: Self-pay | Admitting: Internal Medicine

## 2018-03-02 NOTE — Telephone Encounter (Signed)
Rx request sent to pharmacy.  

## 2018-03-09 DIAGNOSIS — E7800 Pure hypercholesterolemia, unspecified: Secondary | ICD-10-CM | POA: Diagnosis not present

## 2018-03-14 DIAGNOSIS — I10 Essential (primary) hypertension: Secondary | ICD-10-CM | POA: Diagnosis not present

## 2018-03-14 DIAGNOSIS — I2510 Atherosclerotic heart disease of native coronary artery without angina pectoris: Secondary | ICD-10-CM | POA: Diagnosis not present

## 2018-03-14 DIAGNOSIS — F418 Other specified anxiety disorders: Secondary | ICD-10-CM | POA: Diagnosis not present

## 2018-03-14 DIAGNOSIS — E7800 Pure hypercholesterolemia, unspecified: Secondary | ICD-10-CM | POA: Diagnosis not present

## 2018-03-14 DIAGNOSIS — M7989 Other specified soft tissue disorders: Secondary | ICD-10-CM | POA: Diagnosis not present

## 2018-03-15 ENCOUNTER — Other Ambulatory Visit: Payer: Self-pay | Admitting: Internal Medicine

## 2018-03-15 DIAGNOSIS — M7989 Other specified soft tissue disorders: Secondary | ICD-10-CM

## 2018-03-17 ENCOUNTER — Other Ambulatory Visit: Payer: Medicare Other

## 2018-03-22 ENCOUNTER — Ambulatory Visit
Admission: RE | Admit: 2018-03-22 | Discharge: 2018-03-22 | Disposition: A | Discharge status: Home / Self Care | Payer: Medicare Other | Source: Ambulatory Visit | Attending: Internal Medicine | Admitting: Internal Medicine

## 2018-03-22 DIAGNOSIS — M7989 Other specified soft tissue disorders: Secondary | ICD-10-CM

## 2018-03-24 DIAGNOSIS — D229 Melanocytic nevi, unspecified: Secondary | ICD-10-CM | POA: Diagnosis not present

## 2018-03-24 DIAGNOSIS — L814 Other melanin hyperpigmentation: Secondary | ICD-10-CM | POA: Diagnosis not present

## 2018-03-24 DIAGNOSIS — L905 Scar conditions and fibrosis of skin: Secondary | ICD-10-CM | POA: Diagnosis not present

## 2018-03-24 DIAGNOSIS — D485 Neoplasm of uncertain behavior of skin: Secondary | ICD-10-CM | POA: Diagnosis not present

## 2018-03-24 DIAGNOSIS — Z48817 Encounter for surgical aftercare following surgery on the skin and subcutaneous tissue: Secondary | ICD-10-CM | POA: Diagnosis not present

## 2018-03-24 DIAGNOSIS — L821 Other seborrheic keratosis: Secondary | ICD-10-CM | POA: Diagnosis not present

## 2018-03-24 DIAGNOSIS — L819 Disorder of pigmentation, unspecified: Secondary | ICD-10-CM | POA: Diagnosis not present

## 2018-03-24 DIAGNOSIS — D1801 Hemangioma of skin and subcutaneous tissue: Secondary | ICD-10-CM | POA: Diagnosis not present

## 2018-04-06 DIAGNOSIS — M25461 Effusion, right knee: Secondary | ICD-10-CM | POA: Diagnosis not present

## 2018-04-06 DIAGNOSIS — S8001XA Contusion of right knee, initial encounter: Secondary | ICD-10-CM | POA: Diagnosis not present

## 2018-04-06 DIAGNOSIS — M25561 Pain in right knee: Secondary | ICD-10-CM | POA: Diagnosis not present

## 2018-04-19 DIAGNOSIS — L255 Unspecified contact dermatitis due to plants, except food: Secondary | ICD-10-CM | POA: Diagnosis not present

## 2018-04-27 DIAGNOSIS — M25461 Effusion, right knee: Secondary | ICD-10-CM | POA: Diagnosis not present

## 2018-04-27 DIAGNOSIS — S8001XA Contusion of right knee, initial encounter: Secondary | ICD-10-CM | POA: Diagnosis not present

## 2018-04-27 DIAGNOSIS — M25561 Pain in right knee: Secondary | ICD-10-CM | POA: Diagnosis not present

## 2018-05-31 DIAGNOSIS — Z23 Encounter for immunization: Secondary | ICD-10-CM | POA: Diagnosis not present

## 2018-06-23 DIAGNOSIS — H04123 Dry eye syndrome of bilateral lacrimal glands: Secondary | ICD-10-CM | POA: Diagnosis not present

## 2018-06-23 DIAGNOSIS — H524 Presbyopia: Secondary | ICD-10-CM | POA: Diagnosis not present

## 2018-06-23 DIAGNOSIS — H2513 Age-related nuclear cataract, bilateral: Secondary | ICD-10-CM | POA: Diagnosis not present

## 2018-06-23 DIAGNOSIS — H52223 Regular astigmatism, bilateral: Secondary | ICD-10-CM | POA: Diagnosis not present

## 2018-06-23 DIAGNOSIS — H5213 Myopia, bilateral: Secondary | ICD-10-CM | POA: Diagnosis not present

## 2018-07-29 DIAGNOSIS — H2513 Age-related nuclear cataract, bilateral: Secondary | ICD-10-CM | POA: Diagnosis not present

## 2018-07-29 DIAGNOSIS — H2512 Age-related nuclear cataract, left eye: Secondary | ICD-10-CM | POA: Diagnosis not present

## 2018-08-17 DIAGNOSIS — M25512 Pain in left shoulder: Secondary | ICD-10-CM | POA: Diagnosis not present

## 2018-08-17 DIAGNOSIS — S42032A Displaced fracture of lateral end of left clavicle, initial encounter for closed fracture: Secondary | ICD-10-CM | POA: Diagnosis not present

## 2018-08-24 DIAGNOSIS — S42002K Fracture of unspecified part of left clavicle, subsequent encounter for fracture with nonunion: Secondary | ICD-10-CM | POA: Diagnosis not present

## 2018-08-25 DIAGNOSIS — H2512 Age-related nuclear cataract, left eye: Secondary | ICD-10-CM | POA: Diagnosis not present

## 2018-08-25 DIAGNOSIS — H25012 Cortical age-related cataract, left eye: Secondary | ICD-10-CM | POA: Diagnosis not present

## 2018-08-26 DIAGNOSIS — H2511 Age-related nuclear cataract, right eye: Secondary | ICD-10-CM | POA: Diagnosis not present

## 2018-09-02 ENCOUNTER — Other Ambulatory Visit: Payer: Self-pay | Admitting: Internal Medicine

## 2018-09-05 ENCOUNTER — Other Ambulatory Visit: Payer: Self-pay | Admitting: Internal Medicine

## 2018-09-09 DIAGNOSIS — M25512 Pain in left shoulder: Secondary | ICD-10-CM | POA: Diagnosis not present

## 2018-09-14 DIAGNOSIS — E559 Vitamin D deficiency, unspecified: Secondary | ICD-10-CM | POA: Diagnosis not present

## 2018-09-14 DIAGNOSIS — E786 Lipoprotein deficiency: Secondary | ICD-10-CM | POA: Diagnosis not present

## 2018-09-14 DIAGNOSIS — I10 Essential (primary) hypertension: Secondary | ICD-10-CM | POA: Diagnosis not present

## 2018-09-15 DIAGNOSIS — H2511 Age-related nuclear cataract, right eye: Secondary | ICD-10-CM | POA: Diagnosis not present

## 2018-09-15 DIAGNOSIS — H25011 Cortical age-related cataract, right eye: Secondary | ICD-10-CM | POA: Diagnosis not present

## 2018-09-19 DIAGNOSIS — E7800 Pure hypercholesterolemia, unspecified: Secondary | ICD-10-CM | POA: Diagnosis not present

## 2018-09-19 DIAGNOSIS — Z0000 Encounter for general adult medical examination without abnormal findings: Secondary | ICD-10-CM | POA: Diagnosis not present

## 2018-09-19 DIAGNOSIS — F418 Other specified anxiety disorders: Secondary | ICD-10-CM | POA: Diagnosis not present

## 2018-09-19 DIAGNOSIS — I2510 Atherosclerotic heart disease of native coronary artery without angina pectoris: Secondary | ICD-10-CM | POA: Diagnosis not present

## 2018-09-19 DIAGNOSIS — I10 Essential (primary) hypertension: Secondary | ICD-10-CM | POA: Diagnosis not present

## 2018-09-21 ENCOUNTER — Other Ambulatory Visit: Payer: Self-pay | Admitting: Internal Medicine

## 2018-09-21 DIAGNOSIS — Z1231 Encounter for screening mammogram for malignant neoplasm of breast: Secondary | ICD-10-CM

## 2018-10-13 ENCOUNTER — Ambulatory Visit: Payer: Medicare Other

## 2018-11-14 ENCOUNTER — Telehealth: Payer: Self-pay | Admitting: *Deleted

## 2018-11-14 NOTE — Telephone Encounter (Signed)
11/14/18  LMOM @ 1:54 pm, IW:[id number] up appointment.

## 2018-12-09 ENCOUNTER — Ambulatory Visit
Admission: RE | Admit: 2018-12-09 | Discharge: 2018-12-09 | Disposition: A | Discharge status: Home / Self Care | Payer: Medicare Other | Source: Ambulatory Visit | Attending: Internal Medicine | Admitting: Internal Medicine

## 2018-12-09 ENCOUNTER — Other Ambulatory Visit: Payer: Self-pay

## 2018-12-09 DIAGNOSIS — Z1231 Encounter for screening mammogram for malignant neoplasm of breast: Secondary | ICD-10-CM

## 2019-02-16 ENCOUNTER — Other Ambulatory Visit: Payer: Self-pay

## 2019-03-21 DIAGNOSIS — E7800 Pure hypercholesterolemia, unspecified: Secondary | ICD-10-CM | POA: Diagnosis not present

## 2019-04-03 ENCOUNTER — Ambulatory Visit
Admission: RE | Admit: 2019-04-03 | Discharge: 2019-04-03 | Disposition: A | Discharge status: Home / Self Care | Payer: Medicare Other | Source: Ambulatory Visit | Attending: Internal Medicine | Admitting: Internal Medicine

## 2019-04-03 ENCOUNTER — Other Ambulatory Visit: Payer: Self-pay | Admitting: Internal Medicine

## 2019-04-03 DIAGNOSIS — Z23 Encounter for immunization: Secondary | ICD-10-CM | POA: Diagnosis not present

## 2019-04-03 DIAGNOSIS — Z86718 Personal history of other venous thrombosis and embolism: Secondary | ICD-10-CM | POA: Diagnosis not present

## 2019-04-03 DIAGNOSIS — M25552 Pain in left hip: Secondary | ICD-10-CM | POA: Diagnosis not present

## 2019-04-03 DIAGNOSIS — I2510 Atherosclerotic heart disease of native coronary artery without angina pectoris: Secondary | ICD-10-CM | POA: Diagnosis not present

## 2019-04-03 DIAGNOSIS — M160 Bilateral primary osteoarthritis of hip: Secondary | ICD-10-CM | POA: Diagnosis not present

## 2019-04-03 DIAGNOSIS — E7800 Pure hypercholesterolemia, unspecified: Secondary | ICD-10-CM | POA: Diagnosis not present

## 2019-04-03 DIAGNOSIS — M79662 Pain in left lower leg: Secondary | ICD-10-CM | POA: Diagnosis not present

## 2019-04-03 DIAGNOSIS — M79605 Pain in left leg: Secondary | ICD-10-CM

## 2019-04-03 DIAGNOSIS — I10 Essential (primary) hypertension: Secondary | ICD-10-CM | POA: Diagnosis not present

## 2019-04-17 ENCOUNTER — Other Ambulatory Visit: Payer: Self-pay

## 2019-04-17 DIAGNOSIS — I82412 Acute embolism and thrombosis of left femoral vein: Secondary | ICD-10-CM

## 2019-04-18 ENCOUNTER — Other Ambulatory Visit: Payer: Self-pay

## 2019-04-18 ENCOUNTER — Ambulatory Visit (INDEPENDENT_AMBULATORY_CARE_PROVIDER_SITE_OTHER): Payer: Medicare Other | Admitting: Vascular Surgery

## 2019-04-18 ENCOUNTER — Ambulatory Visit (HOSPITAL_COMMUNITY)
Admission: RE | Admit: 2019-04-18 | Discharge: 2019-04-18 | Disposition: A | Discharge status: Home / Self Care | Payer: Medicare Other | Source: Ambulatory Visit | Attending: Family | Admitting: Family

## 2019-04-18 ENCOUNTER — Encounter: Payer: Self-pay | Admitting: Vascular Surgery

## 2019-04-18 DIAGNOSIS — I82412 Acute embolism and thrombosis of left femoral vein: Secondary | ICD-10-CM | POA: Insufficient documentation

## 2019-04-18 DIAGNOSIS — R1032 Left lower quadrant pain: Secondary | ICD-10-CM | POA: Diagnosis not present

## 2019-04-18 NOTE — Progress Notes (Signed)
Patient name: Katie Collins MRN: [id number] DOB: March 08, 1946 Sex: female  REASON FOR CONSULT: Evaluate left femoral vein thickening  HPI: Katie Collins is a 73 y.o. female, with history of hypertension, hyperlipidemia, coronary artery disease, melanoma and a remote history of two left lower extremity DVTs that presents for evaluation of left femoral vein thickening.  Patient reports that about 2 weeks ago she started having pretty severe left groin pain they radiated into her left thigh as well as around to her left hip.  Ultimately she was evaluated by her PCP and got hip x-ray as well as a venous duplex of her left lower extremity.  Left lower extremity duplex showed no evidence of acute DVT but there was some nonocclusive wall thickening of the left common femoral vein.  Patient states she has had no leg swelling.  She states the pain comes and goes without any aggravating factors.  Fortunately this has improved significantly over the last several weeks.  Her PCP wanted vascular to evaluate this findings on venous duplex.  She states she had several clots in her left lower extremity.  Her first clot was in the 1980s following hysterectomy when she had an extensive left leg DVT.  Do not have the records to review.  She then had a subsequent left leg DVT in about 2014 or 2015 according to her her memory.  Past Medical History:  Diagnosis Date  . Anxiety   . CAD (coronary artery disease)   . Deep vein thrombosis (DVT) ([place]) 1987   REMOTE H/O; PT ALSO HAD 03/2012 A SOLEAL VEIN DVT LEFT LEG DUE TO FALL; WAS THEN STARTED ON XARELTO 04/22/12  . Depression   . Endometriosis   . Fibroid   . Hyperlipidemia   . Hypertension   . Melanoma [facility])     Past Surgical History:  Procedure Laterality Date  . ABDOMINAL HYSTERECTOMY  1987   TAH,BSO  . BREAST EXCISIONAL BIOPSY Left 2013   benign  . BREAST SURGERY  04/2011   mass in left breast, fibrosis  . CARDIAC CATHETERIZATION  1990   PT HAD  ANOTHER CATH 04/06/02; [place]; NORMAL LV FUNCTION; NORMAL MITRAL AND AORTIC VALVES; NORMAL ABDOMINAL AORTA  AND RENAL ARTERIES  . CAROTID DOPPLER  09/27/02   NORMAL COMMON AND INTERNAL CAROTID ARTERIES BILATERALLY W/NORMAL FLOW SEEN IN BOTH INTERNAL CAROTID ARTERIE. ; ANTEGRADE FLOW IN BOTH VERTEBRAL ARTERIES  . JOINT REPLACEMENT  2005   Left rotator cuff replacement   . LOWER EXTREMITY VENOUS DUPLEX  04/21/12   FINDINGS CONSISTENT W/ACUTE DVT INVOLVING THE LEFT SOLEAL VEIN; ALSO LEFT COMMON FEMORAL VEIN  . MYOCARDIAL PERFUSION  03/27/02   NEGATIVE ADEQUATE BRUCE PROTOCOL W/DECONDITIONED EXERCISE RESPONSE, NORMAL STATIC AND DYNAMIC MYOCARDIAL PERFUSION IMAGES W/EF QGS 60%; LOW RSIK STUDY  . PATELLA FRACTURE SURGERY Left 10/13    Family History  Problem Relation Age of Onset  . Cancer Father        lung; PASSED AWAY 07-Sep-1994  . Hyperlipidemia Father   . Hypertension Father   . Heart disease Father 48  . Cancer Maternal Aunt        colon  . Cancer Maternal Grandmother        colon  . Aneurysm Mother        thoracic aneurysm  . Cancer Maternal Uncle        lung    SOCIAL HISTORY: Social History   Socioeconomic History  . Marital status: Married  Spouse name: Not on file  . Number of children: 2  . Years of education: 2  . Highest education level: Not on file  Occupational History    Employer: [employer]  Social Needs  . Financial resource strain: Not on file  . Food insecurity    Worry: Not on file    Inability: Not on file  . Transportation needs    Medical: Not on file    Non-medical: Not on file  Tobacco Use  . Smoking status: Never Smoker  . Smokeless tobacco: Never Used  Substance and Sexual Activity  . Alcohol use: Yes    Alcohol/week: 2.0 - 3.0 standard drinks    Types: 2 - 3 Standard drinks or equivalent per week    Comment: OCCASIONALLY DRINKS WINE  . Drug use: No  . Sexual activity: Yes    Partners: Male     Birth control/protection: Surgical    Comment: TAH/BSO  Lifestyle  . Physical activity    Days per week: Not on file    Minutes per session: Not on file  . Stress: Not on file  Relationships  . Social Herbalist on phone: Not on file    Gets together: Not on file    Attends religious service: Not on file    Active member of club or organization: Not on file    Attends meetings of clubs or organizations: Not on file    Relationship status: Not on file  . Intimate partner violence    Fear of current or ex partner: Not on file    Emotionally abused: Not on file    Physically abused: Not on file    Forced sexual activity: Not on file  Other Topics Concern  . Not on file  Social History Narrative   PT EXERCISES; WALKS ABOUT 4 TIMES WEEKLY FOR 30 MINUTES    No Known Allergies  Current Outpatient Medications  Medication Sig Dispense Refill  . acyclovir (ZOVIRAX) 800 MG tablet as needed.    Marland Kitchen aspirin 325 MG EC tablet Take 325 mg by mouth daily.      Marland Kitchen atorvastatin (LIPITOR) 40 MG tablet Take 40 mg by mouth daily.      Marland Kitchen CALCIUM PO Take by mouth daily.    . chlorthalidone (HYGROTON) 25 MG tablet Take 1 tablet (25 mg total) by mouth daily. NEED OV. 90 tablet 0  . Cholecalciferol (VITAMIN D PO) Take 10,000 Int'l Units by mouth once a week.    . escitalopram (LEXAPRO) 10 MG tablet TAKE 1 TABLET BY MOUTH EVERY DAY 30 tablet 1  . ezetimibe (ZETIA) 10 MG tablet Take 10 mg by mouth daily.      . irbesartan (AVAPRO) 300 MG tablet Take 1 tablet by mouth daily.  11  . metoprolol succinate (TOPROL-XL) 25 MG 24 hr tablet Take 0.5 tablets (12.5 mg total) by mouth daily. 15 tablet 11  . Multiple Vitamins-Minerals (MULTIVITAMIN PO) Take by mouth daily.     No current facility-administered medications for this visit.     REVIEW OF SYSTEMS:  [X]  denotes positive finding, [ ]  denotes negative finding Cardiac  Comments:  Chest pain or chest pressure:    Shortness of breath upon  exertion:    Short of breath when lying flat:    Irregular heart rhythm:        Vascular    Pain in calf, thigh, or hip brought on by ambulation:    Pain in  feet at night that wakes you up from your sleep:     Blood clot in your veins:    Leg swelling:         Pulmonary    Oxygen at home:    Productive cough:     Wheezing:         Neurologic    Sudden weakness in arms or legs:     Sudden numbness in arms or legs:     Sudden onset of difficulty speaking or slurred speech:    Temporary loss of vision in one eye:     Problems with dizziness:         Gastrointestinal    Blood in stool:     Vomited blood:         Genitourinary    Burning when urinating:     Blood in urine:        Psychiatric    Major depression:         Hematologic    Bleeding problems:    Problems with blood clotting too easily:        Skin    Rashes or ulcers:        Constitutional    Fever or chills:      PHYSICAL EXAM: Vitals:   04/18/19 1051  BP: (!) 146/87  Pulse: (!) 57  Resp: 14  Temp: 97.8 F (36.6 C)  TempSrc: Temporal  SpO2: 98%  Weight: 169 lb 9.6 oz (76.9 kg)  Height: 5\' 8"  (1.727 m)    GENERAL: The patient is a well-nourished female, in no acute distress. The vital signs are documented above. CARDIAC: There is a regular rate and rhythm.  VASCULAR:  2+ radial pulse palpable bilaterally 2+ femoral pulse palpable bilaterally 1+ dorsalis pedis and posterior tibial pulses palpable bilaterally No lower extremity tissue loss No lower extremity leg swelling Legs are equal in circumference bilaterally PULMONARY: There is good air exchange bilaterally without wheezing or rales. ABDOMEN: Soft and non-tender with normal pitched bowel sounds.  MUSCULOSKELETAL: There are no major deformities or cyanosis. NEUROLOGIC: No focal weakness or paresthesias are detected. SKIN: There are no ulcers or rashes noted. PSYCHIATRIC: The patient has a normal affect.  DATA:   I independently  reviewed her venous ultrasound of the left lower extremity from another facility.  I see no evidence of DVT and all the lower extremity veins appear patent.  There is very subtle thickening of the left common femoral vein.  Assessment/Plan:  73 year old female who presents for evaluation of left common femoral vein thickening noted on recent venous duplex in the setting of two previous left lower extremity DVTs and new left groin pain.  In review of her recent left leg duplex there is no evidence of acute DVT.  I think this femoral vein thickening is a subtle incidental finding.  The common femoral vein is actually patent.  This is likely a chronic change given previous DVT's in left leg and the fact that she had evidence of chronic clot in the left common femoral vein on a duplex from back in 2013.  She has no leg swelling or other symptoms to suggest other severe venous etiology at this time.  I think she can exercise and do all the activities that she wants to do.  She also had an ABI study today.  She has triphasic waveforms at both ankles which is normal.  On exam she also has palpable pedal pulses.  Her symptoms do not  suggest arterial disease based on her description.  Do not think she needs any intervention from the standpoint.  I offered her PRN follow-up.   Marty Heck, MD [facility] Office: [phone] Pager: [place]

## 2019-08-17 DIAGNOSIS — D485 Neoplasm of uncertain behavior of skin: Secondary | ICD-10-CM | POA: Diagnosis not present

## 2019-08-17 DIAGNOSIS — L905 Scar conditions and fibrosis of skin: Secondary | ICD-10-CM | POA: Diagnosis not present

## 2019-08-17 DIAGNOSIS — Z85828 Personal history of other malignant neoplasm of skin: Secondary | ICD-10-CM | POA: Diagnosis not present

## 2019-08-17 DIAGNOSIS — Z85820 Personal history of malignant melanoma of skin: Secondary | ICD-10-CM | POA: Diagnosis not present

## 2019-08-17 DIAGNOSIS — B079 Viral wart, unspecified: Secondary | ICD-10-CM | POA: Diagnosis not present

## 2019-08-17 DIAGNOSIS — L814 Other melanin hyperpigmentation: Secondary | ICD-10-CM | POA: Diagnosis not present

## 2019-08-17 DIAGNOSIS — L819 Disorder of pigmentation, unspecified: Secondary | ICD-10-CM | POA: Diagnosis not present

## 2019-08-17 DIAGNOSIS — L821 Other seborrheic keratosis: Secondary | ICD-10-CM | POA: Diagnosis not present

## 2019-08-17 DIAGNOSIS — D229 Melanocytic nevi, unspecified: Secondary | ICD-10-CM | POA: Diagnosis not present

## 2019-09-03 ENCOUNTER — Ambulatory Visit: Payer: Medicare Other | Attending: Internal Medicine

## 2019-09-03 DIAGNOSIS — Z23 Encounter for immunization: Secondary | ICD-10-CM | POA: Insufficient documentation

## 2019-09-03 NOTE — Progress Notes (Signed)
   Covid-19 Vaccination Clinic  Name:  Katie Collins    MRN: [id number] DOB: 02/28/1946  09/03/2019  Katie Collins was observed post Covid-19 immunization for 15 minutes without incidence. She was provided with Vaccine Information Sheet and instruction to access the V-Safe system.   Katie Collins was instructed to call 911 with any severe reactions post vaccine: Marland Kitchen Difficulty breathing  . Swelling of your face and throat  . A fast heartbeat  . A bad rash all over your body  . Dizziness and weakness    Immunizations Administered    Name Date Dose VIS Date Route   Pfizer COVID-19 Vaccine 09/03/2019  8:43 AM 0.3 mL 06/30/2019 Intramuscular   Manufacturer: [place]   Lot: [id number]   [place]: [id number]

## 2019-09-25 ENCOUNTER — Ambulatory Visit: Payer: Medicare Other | Attending: Internal Medicine

## 2019-09-25 DIAGNOSIS — Z23 Encounter for immunization: Secondary | ICD-10-CM | POA: Insufficient documentation

## 2019-09-25 NOTE — Progress Notes (Signed)
   Covid-19 Vaccination Clinic  Name:  Odean Quillen    MRN: [id number] DOB: [birth year]  09/25/2019  Ms. Cuttino was observed post Covid-19 immunization for 15 minutes without incident. She was provided with Vaccine Information Sheet and instruction to access the V-Safe system.   Ms. Koehne was instructed to call 911 with any severe reactions post vaccine: Marland Kitchen Difficulty breathing  . Swelling of face and throat  . A fast heartbeat  . A bad rash all over body  . Dizziness and weakness   Immunizations Administered    Name Date Dose VIS Date Route   Pfizer COVID-19 Vaccine 09/25/2019  4:46 PM 0.3 mL 06/30/2019 Intramuscular   Manufacturer: [place]   Lot: [id number]   [place]: [id number]

## 2019-09-28 DIAGNOSIS — E786 Lipoprotein deficiency: Secondary | ICD-10-CM | POA: Diagnosis not present

## 2019-09-28 DIAGNOSIS — I10 Essential (primary) hypertension: Secondary | ICD-10-CM | POA: Diagnosis not present

## 2019-09-28 DIAGNOSIS — Z0000 Encounter for general adult medical examination without abnormal findings: Secondary | ICD-10-CM | POA: Diagnosis not present

## 2019-09-28 DIAGNOSIS — E559 Vitamin D deficiency, unspecified: Secondary | ICD-10-CM | POA: Diagnosis not present

## 2019-09-28 DIAGNOSIS — E7800 Pure hypercholesterolemia, unspecified: Secondary | ICD-10-CM | POA: Diagnosis not present

## 2019-09-28 DIAGNOSIS — E789 Disorder of lipoprotein metabolism, unspecified: Secondary | ICD-10-CM | POA: Diagnosis not present

## 2019-10-03 DIAGNOSIS — I2510 Atherosclerotic heart disease of native coronary artery without angina pectoris: Secondary | ICD-10-CM | POA: Diagnosis not present

## 2019-10-03 DIAGNOSIS — Z0000 Encounter for general adult medical examination without abnormal findings: Secondary | ICD-10-CM | POA: Diagnosis not present

## 2019-10-03 DIAGNOSIS — M8589 Other specified disorders of bone density and structure, multiple sites: Secondary | ICD-10-CM | POA: Diagnosis not present

## 2019-10-03 DIAGNOSIS — Z780 Asymptomatic menopausal state: Secondary | ICD-10-CM | POA: Diagnosis not present

## 2019-10-03 DIAGNOSIS — Z01419 Encounter for gynecological examination (general) (routine) without abnormal findings: Secondary | ICD-10-CM | POA: Diagnosis not present

## 2019-10-03 DIAGNOSIS — I10 Essential (primary) hypertension: Secondary | ICD-10-CM | POA: Diagnosis not present

## 2019-10-03 DIAGNOSIS — Z1212 Encounter for screening for malignant neoplasm of rectum: Secondary | ICD-10-CM | POA: Diagnosis not present

## 2019-10-03 DIAGNOSIS — E7800 Pure hypercholesterolemia, unspecified: Secondary | ICD-10-CM | POA: Diagnosis not present

## 2019-10-03 DIAGNOSIS — M810 Age-related osteoporosis without current pathological fracture: Secondary | ICD-10-CM | POA: Diagnosis not present

## 2019-10-10 ENCOUNTER — Encounter: Payer: Self-pay | Admitting: Certified Nurse Midwife

## 2019-11-20 ENCOUNTER — Other Ambulatory Visit: Payer: Self-pay | Admitting: Internal Medicine

## 2019-11-20 DIAGNOSIS — Z1231 Encounter for screening mammogram for malignant neoplasm of breast: Secondary | ICD-10-CM

## 2019-12-08 ENCOUNTER — Ambulatory Visit: Payer: Medicare Other | Admitting: Internal Medicine

## 2019-12-12 ENCOUNTER — Other Ambulatory Visit: Payer: Self-pay

## 2019-12-12 ENCOUNTER — Ambulatory Visit
Admission: RE | Admit: 2019-12-12 | Discharge: 2019-12-12 | Disposition: A | Discharge status: Home / Self Care | Payer: Medicare Other | Source: Ambulatory Visit | Attending: Internal Medicine | Admitting: Internal Medicine

## 2019-12-12 DIAGNOSIS — Z1231 Encounter for screening mammogram for malignant neoplasm of breast: Secondary | ICD-10-CM | POA: Diagnosis not present

## 2020-01-31 ENCOUNTER — Ambulatory Visit (INDEPENDENT_AMBULATORY_CARE_PROVIDER_SITE_OTHER): Payer: Medicare Other | Admitting: Internal Medicine

## 2020-01-31 ENCOUNTER — Encounter: Payer: Self-pay | Admitting: Internal Medicine

## 2020-01-31 ENCOUNTER — Other Ambulatory Visit: Payer: Self-pay

## 2020-01-31 VITALS — BP 122/90 | HR 61 | Ht 68.0 in | Wt 172.4 lb

## 2020-01-31 DIAGNOSIS — E782 Mixed hyperlipidemia: Secondary | ICD-10-CM | POA: Diagnosis not present

## 2020-01-31 DIAGNOSIS — I10 Essential (primary) hypertension: Secondary | ICD-10-CM | POA: Diagnosis not present

## 2020-01-31 DIAGNOSIS — I2510 Atherosclerotic heart disease of native coronary artery without angina pectoris: Secondary | ICD-10-CM

## 2020-01-31 NOTE — Patient Instructions (Signed)
Medication Instructions:  Your physician recommends that you continue on your current medications as directed. Please refer to the Current Medication list given to you today.  *If you need a refill on your cardiac medications before your next appointment, please call your pharmacy*   Follow-Up: At [facility], you and your health needs are our priority.  As part of our continuing mission to provide you with exceptional heart care, we have created designated Provider Care Teams.  These Care Teams include your primary Cardiologist (physician) and Advanced Practice Providers (APPs -  Physician Assistants and Nurse Practitioners) who all work together to provide you with the care you need, when you need it.  We recommend signing up for the patient portal called "MyChart".  Sign up information is provided on this After Visit Summary.  MyChart is used to connect with patients for Virtual Visits (Telemedicine).  Patients are able to view lab/test results, encounter notes, upcoming appointments, etc.  Non-urgent messages can be sent to your provider as well.   To learn more about what you can do with MyChart, go to [email or web address].    Your next appointment:   12 month(s)  The format for your next appointment:   Either In Person or Virtual  Provider:   You may see Kenneth C Hilty, MD or one of the following Advanced Practice Providers on your designated Care Team:    Hao Meng, PA-C  Angela Duke, PA-C or   Krista Kroeger, PA-C    Other Instructions   
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
## 2020-03-01 DIAGNOSIS — H04123 Dry eye syndrome of bilateral lacrimal glands: Secondary | ICD-10-CM | POA: Diagnosis not present

## 2020-03-01 DIAGNOSIS — H40023 Open angle with borderline findings, high risk, bilateral: Secondary | ICD-10-CM | POA: Diagnosis not present

## 2020-03-01 DIAGNOSIS — Z961 Presence of intraocular lens: Secondary | ICD-10-CM | POA: Diagnosis not present

## 2020-04-01 DIAGNOSIS — E7800 Pure hypercholesterolemia, unspecified: Secondary | ICD-10-CM | POA: Diagnosis not present

## 2020-04-04 DIAGNOSIS — E7800 Pure hypercholesterolemia, unspecified: Secondary | ICD-10-CM | POA: Diagnosis not present

## 2020-04-04 DIAGNOSIS — Z23 Encounter for immunization: Secondary | ICD-10-CM | POA: Diagnosis not present

## 2020-04-04 DIAGNOSIS — I10 Essential (primary) hypertension: Secondary | ICD-10-CM | POA: Diagnosis not present

## 2020-04-04 DIAGNOSIS — F418 Other specified anxiety disorders: Secondary | ICD-10-CM | POA: Diagnosis not present

## 2020-04-04 DIAGNOSIS — I2510 Atherosclerotic heart disease of native coronary artery without angina pectoris: Secondary | ICD-10-CM | POA: Diagnosis not present

## 2020-04-05 DIAGNOSIS — H5203 Hypermetropia, bilateral: Secondary | ICD-10-CM | POA: Diagnosis not present

## 2020-04-05 DIAGNOSIS — H04123 Dry eye syndrome of bilateral lacrimal glands: Secondary | ICD-10-CM | POA: Diagnosis not present

## 2020-04-05 DIAGNOSIS — H524 Presbyopia: Secondary | ICD-10-CM | POA: Diagnosis not present

## 2020-04-05 DIAGNOSIS — H40023 Open angle with borderline findings, high risk, bilateral: Secondary | ICD-10-CM | POA: Diagnosis not present

## 2020-04-05 DIAGNOSIS — Z961 Presence of intraocular lens: Secondary | ICD-10-CM | POA: Diagnosis not present

## 2020-04-05 DIAGNOSIS — H52223 Regular astigmatism, bilateral: Secondary | ICD-10-CM | POA: Diagnosis not present

## 2020-04-26 DIAGNOSIS — Z23 Encounter for immunization: Secondary | ICD-10-CM | POA: Diagnosis not present

## 2020-10-03 ENCOUNTER — Encounter: Payer: Self-pay | Admitting: Internal Medicine

## 2020-10-03 DIAGNOSIS — I10 Essential (primary) hypertension: Secondary | ICD-10-CM | POA: Diagnosis not present

## 2020-10-03 DIAGNOSIS — E7800 Pure hypercholesterolemia, unspecified: Secondary | ICD-10-CM | POA: Diagnosis not present

## 2020-10-03 DIAGNOSIS — E559 Vitamin D deficiency, unspecified: Secondary | ICD-10-CM | POA: Diagnosis not present

## 2020-10-03 DIAGNOSIS — F418 Other specified anxiety disorders: Secondary | ICD-10-CM | POA: Diagnosis not present

## 2020-10-03 DIAGNOSIS — E789 Disorder of lipoprotein metabolism, unspecified: Secondary | ICD-10-CM | POA: Diagnosis not present

## 2020-10-08 DIAGNOSIS — Z0000 Encounter for general adult medical examination without abnormal findings: Secondary | ICD-10-CM | POA: Diagnosis not present

## 2020-10-08 DIAGNOSIS — I10 Essential (primary) hypertension: Secondary | ICD-10-CM | POA: Diagnosis not present

## 2020-10-08 DIAGNOSIS — E559 Vitamin D deficiency, unspecified: Secondary | ICD-10-CM | POA: Diagnosis not present

## 2020-10-08 DIAGNOSIS — N6320 Unspecified lump in the left breast, unspecified quadrant: Secondary | ICD-10-CM | POA: Diagnosis not present

## 2020-10-08 DIAGNOSIS — Z01419 Encounter for gynecological examination (general) (routine) without abnormal findings: Secondary | ICD-10-CM | POA: Diagnosis not present

## 2020-10-08 DIAGNOSIS — E7800 Pure hypercholesterolemia, unspecified: Secondary | ICD-10-CM | POA: Diagnosis not present

## 2020-10-08 DIAGNOSIS — I2510 Atherosclerotic heart disease of native coronary artery without angina pectoris: Secondary | ICD-10-CM | POA: Diagnosis not present

## 2020-10-08 DIAGNOSIS — Z8249 Family history of ischemic heart disease and other diseases of the circulatory system: Secondary | ICD-10-CM | POA: Diagnosis not present

## 2020-10-09 ENCOUNTER — Other Ambulatory Visit: Payer: Self-pay | Admitting: Internal Medicine

## 2020-10-09 DIAGNOSIS — Z8249 Family history of ischemic heart disease and other diseases of the circulatory system: Secondary | ICD-10-CM

## 2020-10-10 ENCOUNTER — Other Ambulatory Visit: Payer: Self-pay | Admitting: Internal Medicine

## 2020-10-10 DIAGNOSIS — N63 Unspecified lump in unspecified breast: Secondary | ICD-10-CM

## 2020-11-12 ENCOUNTER — Ambulatory Visit
Admission: RE | Admit: 2020-11-12 | Discharge: 2020-11-12 | Disposition: A | Discharge status: Home / Self Care | Payer: Medicare Other | Source: Ambulatory Visit | Attending: Internal Medicine | Admitting: Internal Medicine

## 2020-11-12 DIAGNOSIS — E785 Hyperlipidemia, unspecified: Secondary | ICD-10-CM | POA: Diagnosis not present

## 2020-11-12 DIAGNOSIS — Z8249 Family history of ischemic heart disease and other diseases of the circulatory system: Secondary | ICD-10-CM

## 2020-11-15 ENCOUNTER — Ambulatory Visit
Admission: RE | Admit: 2020-11-15 | Discharge: 2020-11-15 | Disposition: A | Discharge status: Home / Self Care | Payer: Medicare Other | Source: Ambulatory Visit | Attending: Internal Medicine | Admitting: Internal Medicine

## 2020-11-15 ENCOUNTER — Other Ambulatory Visit: Payer: Self-pay | Admitting: Internal Medicine

## 2020-11-15 ENCOUNTER — Other Ambulatory Visit: Payer: Self-pay

## 2020-11-15 DIAGNOSIS — R928 Other abnormal and inconclusive findings on diagnostic imaging of breast: Secondary | ICD-10-CM | POA: Diagnosis not present

## 2020-11-15 DIAGNOSIS — N63 Unspecified lump in unspecified breast: Secondary | ICD-10-CM

## 2020-11-15 DIAGNOSIS — N6322 Unspecified lump in the left breast, upper inner quadrant: Secondary | ICD-10-CM | POA: Diagnosis not present

## 2020-11-15 DIAGNOSIS — N6324 Unspecified lump in the left breast, lower inner quadrant: Secondary | ICD-10-CM | POA: Diagnosis not present

## 2020-11-15 DIAGNOSIS — Z1231 Encounter for screening mammogram for malignant neoplasm of breast: Secondary | ICD-10-CM

## 2021-01-07 ENCOUNTER — Other Ambulatory Visit: Payer: Self-pay

## 2021-01-07 ENCOUNTER — Ambulatory Visit
Admission: RE | Admit: 2021-01-07 | Discharge: 2021-01-07 | Disposition: A | Discharge status: Home / Self Care | Payer: Medicare Other | Source: Ambulatory Visit | Attending: Internal Medicine | Admitting: Internal Medicine

## 2021-01-07 DIAGNOSIS — Z1231 Encounter for screening mammogram for malignant neoplasm of breast: Secondary | ICD-10-CM | POA: Diagnosis not present

## 2021-04-08 DIAGNOSIS — E7800 Pure hypercholesterolemia, unspecified: Secondary | ICD-10-CM | POA: Diagnosis not present

## 2021-04-11 DIAGNOSIS — I10 Essential (primary) hypertension: Secondary | ICD-10-CM | POA: Diagnosis not present

## 2021-04-11 DIAGNOSIS — E7800 Pure hypercholesterolemia, unspecified: Secondary | ICD-10-CM | POA: Diagnosis not present

## 2021-04-11 DIAGNOSIS — E559 Vitamin D deficiency, unspecified: Secondary | ICD-10-CM | POA: Diagnosis not present

## 2021-04-11 DIAGNOSIS — Z23 Encounter for immunization: Secondary | ICD-10-CM | POA: Diagnosis not present

## 2021-04-17 ENCOUNTER — Encounter (HOSPITAL_BASED_OUTPATIENT_CLINIC_OR_DEPARTMENT_OTHER): Payer: Self-pay

## 2021-05-26 ENCOUNTER — Telehealth: Payer: Self-pay | Admitting: Internal Medicine

## 2021-05-26 NOTE — Telephone Encounter (Signed)
Spoke to patient she stated she woke up this morning with chest pain.Stated pain was similar to pain she had before her heart stent.She took 1 aspirin with relief.She is not having any chest pain at present.She feels weak and tired.Advised to keep appointment already scheduled with Laurann Montana NP 11/10 at 1:30 pm at [facility] location.Advised if she has anymore chest pain to go to ED.I will make Dr.Hilty aware.

## 2021-05-26 NOTE — Telephone Encounter (Signed)
Thanks Malachy Mood - I agree completely with those recommendations.  Dr Lemmie Evens

## 2021-05-26 NOTE — Telephone Encounter (Signed)
Pt c/o of Chest Pain: STAT if CP now or developed within 24 hours  1. Are you having CP right now? no  2. Are you experiencing any other symptoms (ex. SOB, nausea, vomiting, sweating)? SOB, Sweating   3. How long have you been experiencing CP? Started about 1:00 am today   4. Is your CP continuous or coming and going? Comes and goes    5. Have you taken Nitroglycerin? No, patient took a baby aspirin   Patient wanted to make appt with Dr. Debara Pickett to come in and be checked out

## 2021-05-29 ENCOUNTER — Other Ambulatory Visit: Payer: Self-pay

## 2021-05-29 ENCOUNTER — Encounter (HOSPITAL_BASED_OUTPATIENT_CLINIC_OR_DEPARTMENT_OTHER): Payer: Self-pay | Admitting: Family

## 2021-05-29 ENCOUNTER — Encounter (HOSPITAL_BASED_OUTPATIENT_CLINIC_OR_DEPARTMENT_OTHER): Payer: Self-pay | Admitting: Internal Medicine

## 2021-05-29 ENCOUNTER — Ambulatory Visit (INDEPENDENT_AMBULATORY_CARE_PROVIDER_SITE_OTHER): Payer: Medicare Other | Admitting: Family

## 2021-05-29 VITALS — BP 132/74 | HR 53 | Ht 68.0 in | Wt 175.4 lb

## 2021-05-29 DIAGNOSIS — I10 Essential (primary) hypertension: Secondary | ICD-10-CM | POA: Diagnosis not present

## 2021-05-29 DIAGNOSIS — Z86718 Personal history of other venous thrombosis and embolism: Secondary | ICD-10-CM | POA: Diagnosis not present

## 2021-05-29 DIAGNOSIS — I25118 Atherosclerotic heart disease of native coronary artery with other forms of angina pectoris: Secondary | ICD-10-CM

## 2021-05-29 DIAGNOSIS — E785 Hyperlipidemia, unspecified: Secondary | ICD-10-CM | POA: Diagnosis not present

## 2021-05-29 MED ORDER — NITROGLYCERIN 0.4 MG SL SUBL: 0.4000 mg | SUBLINGUAL_TABLET | SUBLINGUAL | 2 refills | Status: AC | PRN

## 2021-05-29 NOTE — Progress Notes (Signed)
Office Visit    Patient Name: Katie Collins Date of Encounter: 05/29/2021  PCP:  Deland Pretty, [facility]  Cardiologist:  Pixie Casino, MD  Advanced Practice Provider:  No care team member to display Electrophysiologist:  None      Chief Complaint    Katie Collins is a 75 y.o. female with a hx of CAD (percutaneous intervention s/p stent 1999, cath 2003 with no significant stenosis), DVT after fall, hysterectomy, HTN, HLD, PVC presents today for chest pain   Past Medical History    Past Medical History:  Diagnosis Date   Anxiety    CAD (coronary artery disease)    Deep vein thrombosis (DVT) ([place]) 1987   REMOTE H/O; PT ALSO HAD 03/2012 A SOLEAL VEIN DVT LEFT LEG DUE TO FALL; WAS THEN STARTED ON XARELTO 04/22/12   Depression    Endometriosis    Fibroid    Hyperlipidemia    Hypertension    Melanoma ([place])    Past Surgical History:  Procedure Laterality Date   ABDOMINAL HYSTERECTOMY  1987   TAH,BSO   BREAST EXCISIONAL BIOPSY Left 2013   benign   BREAST SURGERY  04/2011   mass in left breast, fibrosis   CARDIAC CATHETERIZATION  1990   PT HAD ANOTHER CATH 04/06/02; [place]; NORMAL LV FUNCTION; NORMAL MITRAL AND AORTIC VALVES; NORMAL ABDOMINAL AORTA  AND RENAL ARTERIES   CAROTID DOPPLER  09/27/02   NORMAL COMMON AND INTERNAL CAROTID ARTERIES BILATERALLY W/NORMAL FLOW SEEN IN BOTH INTERNAL CAROTID ARTERIE. ; ANTEGRADE FLOW IN BOTH VERTEBRAL ARTERIES   JOINT REPLACEMENT  2005   Left rotator cuff replacement    LOWER EXTREMITY VENOUS DUPLEX  04/21/12   FINDINGS CONSISTENT W/ACUTE DVT INVOLVING THE LEFT SOLEAL VEIN; ALSO LEFT COMMON FEMORAL VEIN   MYOCARDIAL PERFUSION  03/27/02   NEGATIVE ADEQUATE BRUCE PROTOCOL W/DECONDITIONED EXERCISE RESPONSE, NORMAL STATIC AND DYNAMIC MYOCARDIAL PERFUSION IMAGES W/EF QGS 60%; LOW RSIK STUDY   PATELLA FRACTURE SURGERY Left 10/13    Allergies  No Known  Allergies  History of Present Illness    Katie Collins is a 74 y.o. female with a hx of CAD (percutaneous intervention s/p stent 1999, cath 2003 with no significant stenosis), DVT after fall, hysterectomy, HTN, HLD, PVC last seen 01/31/20.  She was last seen 01/31/20 by Dr. Debara Pickett. Noted occasional LLE edema at previous soleal DVT site which improved with elevation and compression.  She called the office 05/26/21 noting chest pain relieved with 1 aspirin. '  She presents today for follow up.   Tells me she woke up with chest discomfort. It was across her chest. She got up to sit up to sit in her chair with minimal improvement. She had an episode of sweating. No shortness of breath. Felt like someone was on her chest. She took a baby aspirin and some ginger ale. The discomfort lasted a couple of hours. After that she felt wore out and had a headache. Tells me in 199 prior to her angioplasty she can very close to having a major heart attack and her symptoms were similar. Enjoys working outside in her garden as well as walking. She enjoys spending time near her koi pond.  EKGs/Labs/Other Studies Reviewed:   The following studies were reviewed today:  EKG:  EKG is  ordered today.  The ekg ordered today demonstrates SB 53 bpm with occasional PAC and no acute ST/T wave changes.   Recent  Labs: No results found for requested labs within last 8760 hours.  Recent Lipid Panel No results found for: CHOL, TRIG, HDL, CHOLHDL, VLDL, LDLCALC, LDLDIRECT   Home Medications   Current Meds  Medication Sig   acyclovir (ZOVIRAX) 800 MG tablet as needed.   aspirin 325 MG EC tablet Take 325 mg by mouth daily.     atorvastatin (LIPITOR) 40 MG tablet Take 40 mg by mouth daily.     CALCIUM PO Take by mouth daily.   chlorthalidone (HYGROTON) 25 MG tablet Take 1 tablet (25 mg total) by mouth daily. NEED OV.   Cholecalciferol (VITAMIN D PO) Take 10,000 Int'l Units by mouth once a week.   escitalopram (LEXAPRO)  10 MG tablet TAKE 1 TABLET BY MOUTH EVERY DAY   ezetimibe (ZETIA) 10 MG tablet Take 10 mg by mouth daily.     irbesartan (AVAPRO) 300 MG tablet Take 1 tablet by mouth daily.   metoprolol succinate (TOPROL-XL) 25 MG 24 hr tablet Take 0.5 tablets (12.5 mg total) by mouth daily.   Multiple Vitamins-Minerals (MULTIVITAMIN PO) Take by mouth daily.     Review of Systems      All other systems reviewed and are otherwise negative except as noted above.  Physical Exam    VS:  BP 132/74   Pulse (!) 53   Ht 5\' 8"  (1.727 m)   Wt 175 lb 6.4 oz (79.6 kg)   LMP 07/20/1985   SpO2 96%   BMI 26.67 kg/m  , BMI Body mass index is 26.67 kg/m.  Wt Readings from Last 3 Encounters:  05/29/21 175 lb 6.4 oz (79.6 kg)  01/31/20 172 lb 6.4 oz (78.2 kg)  04/18/19 169 lb 9.6 oz (76.9 kg)    GEN: Well nourished, well developed, in no acute distress. HEENT: normal. Neck: Supple, no JVD, carotid bruits, or masses. Cardiac: RRR, no murmurs, rubs, or gallops. No clubbing, cyanosis, edema.  Radials/PT 2+ and equal bilaterally.  Respiratory:  Respirations regular and unlabored, clear to auscultation bilaterally. GI: Soft, nontender, nondistended. MS: No deformity or atrophy. Skin: Warm and dry, no rash. Neuro:  Strength and sensation are intact. Psych: Normal affect.  Assessment & Plan    CAD s/p balloon angioplasty 1999 - Low risk Myoview 07/2017 with LVEF 52%. Episode of chest pain while laying which lasted a few hours which partially resolved with aspirin. Felt similar to her anginal equivalent in 1999. Chest wall is tender on palpation. Most likely musculoskeletal but given it has been >3 years since ischemic evaluation, plan for lexiscan myoview. Heart healthy diet and regular cardiovascular exercise encouraged.    Shared Decision Making/Informed Consent The risks [chest pain, shortness of breath, cardiac arrhythmias, dizziness, blood pressure fluctuations, myocardial infarction, stroke/transient ischemic  attack, nausea, vomiting, allergic reaction, radiation exposure, metallic taste sensation and life-threatening complications (estimated to be 1 in 10,000)], benefits (risk stratification, diagnosing coronary artery disease, treatment guidance) and alternatives of a nuclear stress test were discussed in detail with Ms. Granier and she agrees to proceed.   History of DVT - Continue Aspirin 325mg  daily. No recurrent LE edema.   HTN - BP reasonably well controlled today. BP at home 120/80-134/82. Continue current antihypertensive regimen.   HLD, LDL goal <70 - Continue Zetia 10mg  QD, Atorvastatin 40mg  QD. Denies myalias.   Bradycardia - Asymptomatic. Heart rate at home 55-62. Continue Toprol at current dose of 12.5mg  QD. If she becomes symptomatic, plan to discontinue.   Disposition: Follow up in 1 year(s) with  Pixie Casino, MD or APP. If lexiscan abnormal, will schedule sooner follow up.   Signed, Loel Dubonnet, NP 05/29/2021, 1:43 PM [facility]

## 2021-05-29 NOTE — Patient Instructions (Addendum)
Medication Instructions:  Nitroglycerin As needed- Prescription sent to pharmacy   *If you need a refill on your cardiac medications before your next appointment, please call your pharmacy*   Lab Work: None today     Testing/Procedures: Your physician has requested that you have a lexiscan myoview. For further information please visit [email or web address]. Please follow instruction sheet, as given.    Follow-Up: At [facility], you and your health needs are our priority.  As part of our continuing mission to provide you with exceptional heart care, we have created designated Provider Care Teams.  These Care Teams include your primary Cardiologist (physician) and Advanced Practice Providers (APPs -  Physician Assistants and Nurse Practitioners) who all work together to provide you with the care you need, when you need it.  We recommend signing up for the patient portal called "MyChart".  Sign up information is provided on this After Visit Summary.  MyChart is used to connect with patients for Virtual Visits (Telemedicine).  Patients are able to view lab/test results, encounter notes, upcoming appointments, etc.  Non-urgent messages can be sent to your provider as well.   To learn more about what you can do with MyChart, go to [email or web address].    Your next appointment:   1 year(s)  The format for your next appointment:   In Person  Provider:   Pixie Casino, MD   Other Instructions None

## 2021-06-11 ENCOUNTER — Telehealth (HOSPITAL_COMMUNITY): Payer: Self-pay | Admitting: *Deleted

## 2021-06-11 NOTE — Telephone Encounter (Signed)
Close encounter 

## 2021-06-17 ENCOUNTER — Ambulatory Visit (HOSPITAL_COMMUNITY)
Admission: RE | Admit: 2021-06-17 | Payer: Medicare Other | Source: Ambulatory Visit | Attending: Family | Admitting: Family

## 2021-06-19 ENCOUNTER — Encounter (HOSPITAL_COMMUNITY): Payer: Self-pay | Admitting: Family

## 2021-07-02 ENCOUNTER — Telehealth (HOSPITAL_COMMUNITY): Payer: Self-pay | Admitting: *Deleted

## 2021-07-02 NOTE — Telephone Encounter (Signed)
Close encounter 

## 2021-07-03 ENCOUNTER — Telehealth (HOSPITAL_COMMUNITY): Payer: Self-pay | Admitting: *Deleted

## 2021-07-03 NOTE — Telephone Encounter (Signed)
Close encounter 

## 2021-07-04 ENCOUNTER — Ambulatory Visit (HOSPITAL_COMMUNITY)
Admission: RE | Admit: 2021-07-04 | Discharge: 2021-07-04 | Disposition: A | Discharge status: Home / Self Care | Payer: Medicare Other | Source: Ambulatory Visit | Attending: Cardiovascular Disease | Admitting: Cardiovascular Disease

## 2021-07-04 ENCOUNTER — Encounter (HOSPITAL_BASED_OUTPATIENT_CLINIC_OR_DEPARTMENT_OTHER): Payer: Self-pay

## 2021-07-04 ENCOUNTER — Other Ambulatory Visit: Payer: Self-pay

## 2021-07-04 DIAGNOSIS — I25118 Atherosclerotic heart disease of native coronary artery with other forms of angina pectoris: Secondary | ICD-10-CM | POA: Diagnosis not present

## 2021-07-04 LAB — MYOCARDIAL PERFUSION IMAGING
LV dias vol: 117 mL (ref 46–106)
LV sys vol: 51 mL
Nuc Stress EF: 56 %
Peak HR: 84 {beats}/min
Rest HR: 61 {beats}/min
Rest Nuclear Isotope Dose: 10.4 mCi
SDS: 0
SRS: 2
SSS: 2
ST Depression (mm): 0 mm
Stress Nuclear Isotope Dose: 29.7 mCi
TID: 1.02

## 2021-07-04 MED ORDER — REGADENOSON 0.4 MG/5ML IV SOLN
0.4000 mg | Freq: Once | INTRAVENOUS | Status: AC
  Administered 2021-07-04: 0.4 mg via INTRAVENOUS

## 2021-07-04 MED ORDER — TECHNETIUM TC 99M TETROFOSMIN IV KIT
10.8000 | PACK | Freq: Once | INTRAVENOUS | Status: AC | PRN
  Administered 2021-07-04: 10.4 via INTRAVENOUS
  Filled 2021-07-04: qty 11

## 2021-07-04 MED ORDER — TECHNETIUM TC 99M TETROFOSMIN IV KIT
28.4000 | PACK | Freq: Once | INTRAVENOUS | Status: AC | PRN
  Administered 2021-07-04: 29.7 via INTRAVENOUS
  Filled 2021-07-04: qty 29

## 2021-08-08 DIAGNOSIS — Z23 Encounter for immunization: Secondary | ICD-10-CM | POA: Diagnosis not present

## 2021-08-14 DIAGNOSIS — H26491 Other secondary cataract, right eye: Secondary | ICD-10-CM | POA: Diagnosis not present

## 2021-08-14 DIAGNOSIS — H40023 Open angle with borderline findings, high risk, bilateral: Secondary | ICD-10-CM | POA: Diagnosis not present

## 2021-08-14 DIAGNOSIS — H43811 Vitreous degeneration, right eye: Secondary | ICD-10-CM | POA: Diagnosis not present

## 2021-08-14 DIAGNOSIS — H5203 Hypermetropia, bilateral: Secondary | ICD-10-CM | POA: Diagnosis not present

## 2021-08-14 DIAGNOSIS — H524 Presbyopia: Secondary | ICD-10-CM | POA: Diagnosis not present

## 2021-08-14 DIAGNOSIS — H04123 Dry eye syndrome of bilateral lacrimal glands: Secondary | ICD-10-CM | POA: Diagnosis not present

## 2021-08-14 DIAGNOSIS — Z961 Presence of intraocular lens: Secondary | ICD-10-CM | POA: Diagnosis not present

## 2021-08-14 DIAGNOSIS — H52223 Regular astigmatism, bilateral: Secondary | ICD-10-CM | POA: Diagnosis not present

## 2021-08-29 DIAGNOSIS — H26491 Other secondary cataract, right eye: Secondary | ICD-10-CM | POA: Diagnosis not present

## 2021-08-29 DIAGNOSIS — Z961 Presence of intraocular lens: Secondary | ICD-10-CM | POA: Diagnosis not present

## 2021-08-29 DIAGNOSIS — I10 Essential (primary) hypertension: Secondary | ICD-10-CM | POA: Diagnosis not present

## 2021-08-29 DIAGNOSIS — H26493 Other secondary cataract, bilateral: Secondary | ICD-10-CM | POA: Diagnosis not present

## 2021-09-04 DIAGNOSIS — H40023 Open angle with borderline findings, high risk, bilateral: Secondary | ICD-10-CM | POA: Diagnosis not present

## 2021-09-04 DIAGNOSIS — H04123 Dry eye syndrome of bilateral lacrimal glands: Secondary | ICD-10-CM | POA: Diagnosis not present

## 2021-09-04 DIAGNOSIS — H43811 Vitreous degeneration, right eye: Secondary | ICD-10-CM | POA: Diagnosis not present

## 2021-09-04 DIAGNOSIS — H52223 Regular astigmatism, bilateral: Secondary | ICD-10-CM | POA: Diagnosis not present

## 2021-09-04 DIAGNOSIS — H26491 Other secondary cataract, right eye: Secondary | ICD-10-CM | POA: Diagnosis not present

## 2021-09-04 DIAGNOSIS — Z961 Presence of intraocular lens: Secondary | ICD-10-CM | POA: Diagnosis not present

## 2021-09-04 DIAGNOSIS — H524 Presbyopia: Secondary | ICD-10-CM | POA: Diagnosis not present

## 2021-09-04 DIAGNOSIS — H5203 Hypermetropia, bilateral: Secondary | ICD-10-CM | POA: Diagnosis not present

## 2021-09-12 DIAGNOSIS — H26491 Other secondary cataract, right eye: Secondary | ICD-10-CM | POA: Diagnosis not present

## 2021-09-12 DIAGNOSIS — H04123 Dry eye syndrome of bilateral lacrimal glands: Secondary | ICD-10-CM | POA: Diagnosis not present

## 2021-09-12 DIAGNOSIS — H40023 Open angle with borderline findings, high risk, bilateral: Secondary | ICD-10-CM | POA: Diagnosis not present

## 2021-09-12 DIAGNOSIS — Z961 Presence of intraocular lens: Secondary | ICD-10-CM | POA: Diagnosis not present

## 2021-09-12 DIAGNOSIS — H524 Presbyopia: Secondary | ICD-10-CM | POA: Diagnosis not present

## 2021-09-12 DIAGNOSIS — H5203 Hypermetropia, bilateral: Secondary | ICD-10-CM | POA: Diagnosis not present

## 2021-09-12 DIAGNOSIS — H43811 Vitreous degeneration, right eye: Secondary | ICD-10-CM | POA: Diagnosis not present

## 2021-09-12 DIAGNOSIS — H52223 Regular astigmatism, bilateral: Secondary | ICD-10-CM | POA: Diagnosis not present

## 2021-10-09 DIAGNOSIS — E559 Vitamin D deficiency, unspecified: Secondary | ICD-10-CM | POA: Diagnosis not present

## 2021-10-09 DIAGNOSIS — F418 Other specified anxiety disorders: Secondary | ICD-10-CM | POA: Diagnosis not present

## 2021-10-09 DIAGNOSIS — Z0000 Encounter for general adult medical examination without abnormal findings: Secondary | ICD-10-CM | POA: Diagnosis not present

## 2021-10-09 DIAGNOSIS — N390 Urinary tract infection, site not specified: Secondary | ICD-10-CM | POA: Diagnosis not present

## 2021-10-09 DIAGNOSIS — E789 Disorder of lipoprotein metabolism, unspecified: Secondary | ICD-10-CM | POA: Diagnosis not present

## 2021-10-13 DIAGNOSIS — M85852 Other specified disorders of bone density and structure, left thigh: Secondary | ICD-10-CM | POA: Diagnosis not present

## 2021-10-13 DIAGNOSIS — M85851 Other specified disorders of bone density and structure, right thigh: Secondary | ICD-10-CM | POA: Diagnosis not present

## 2021-10-13 DIAGNOSIS — Z0000 Encounter for general adult medical examination without abnormal findings: Secondary | ICD-10-CM | POA: Diagnosis not present

## 2021-10-13 DIAGNOSIS — I2510 Atherosclerotic heart disease of native coronary artery without angina pectoris: Secondary | ICD-10-CM | POA: Diagnosis not present

## 2021-10-13 DIAGNOSIS — E559 Vitamin D deficiency, unspecified: Secondary | ICD-10-CM | POA: Diagnosis not present

## 2021-10-13 DIAGNOSIS — B001 Herpesviral vesicular dermatitis: Secondary | ICD-10-CM | POA: Diagnosis not present

## 2021-10-13 DIAGNOSIS — E7800 Pure hypercholesterolemia, unspecified: Secondary | ICD-10-CM | POA: Diagnosis not present

## 2021-10-13 DIAGNOSIS — I10 Essential (primary) hypertension: Secondary | ICD-10-CM | POA: Diagnosis not present

## 2022-03-17 ENCOUNTER — Other Ambulatory Visit: Payer: Self-pay | Admitting: Internal Medicine

## 2022-03-17 DIAGNOSIS — Z1231 Encounter for screening mammogram for malignant neoplasm of breast: Secondary | ICD-10-CM

## 2022-04-07 ENCOUNTER — Ambulatory Visit
Admission: RE | Admit: 2022-04-07 | Discharge: 2022-04-07 | Disposition: A | Discharge status: Home / Self Care | Payer: Medicare Other | Source: Ambulatory Visit | Attending: Internal Medicine | Admitting: Internal Medicine

## 2022-04-07 DIAGNOSIS — Z1231 Encounter for screening mammogram for malignant neoplasm of breast: Secondary | ICD-10-CM | POA: Diagnosis not present

## 2022-04-07 DIAGNOSIS — E7800 Pure hypercholesterolemia, unspecified: Secondary | ICD-10-CM | POA: Diagnosis not present

## 2022-04-08 ENCOUNTER — Other Ambulatory Visit: Payer: Self-pay | Admitting: Internal Medicine

## 2022-04-08 DIAGNOSIS — Z1231 Encounter for screening mammogram for malignant neoplasm of breast: Secondary | ICD-10-CM

## 2022-04-14 DIAGNOSIS — M8589 Other specified disorders of bone density and structure, multiple sites: Secondary | ICD-10-CM | POA: Diagnosis not present

## 2022-04-14 DIAGNOSIS — E7800 Pure hypercholesterolemia, unspecified: Secondary | ICD-10-CM | POA: Diagnosis not present

## 2022-04-14 DIAGNOSIS — I2510 Atherosclerotic heart disease of native coronary artery without angina pectoris: Secondary | ICD-10-CM | POA: Diagnosis not present

## 2022-04-14 DIAGNOSIS — M810 Age-related osteoporosis without current pathological fracture: Secondary | ICD-10-CM | POA: Diagnosis not present

## 2022-04-14 DIAGNOSIS — I10 Essential (primary) hypertension: Secondary | ICD-10-CM | POA: Diagnosis not present

## 2022-04-17 DIAGNOSIS — H04123 Dry eye syndrome of bilateral lacrimal glands: Secondary | ICD-10-CM | POA: Diagnosis not present

## 2022-04-17 DIAGNOSIS — H524 Presbyopia: Secondary | ICD-10-CM | POA: Diagnosis not present

## 2022-04-17 DIAGNOSIS — H40023 Open angle with borderline findings, high risk, bilateral: Secondary | ICD-10-CM | POA: Diagnosis not present

## 2022-04-17 DIAGNOSIS — Z961 Presence of intraocular lens: Secondary | ICD-10-CM | POA: Diagnosis not present

## 2022-04-17 DIAGNOSIS — H26491 Other secondary cataract, right eye: Secondary | ICD-10-CM | POA: Diagnosis not present

## 2022-04-17 DIAGNOSIS — H52223 Regular astigmatism, bilateral: Secondary | ICD-10-CM | POA: Diagnosis not present

## 2022-04-17 DIAGNOSIS — H43811 Vitreous degeneration, right eye: Secondary | ICD-10-CM | POA: Diagnosis not present

## 2022-04-17 DIAGNOSIS — H5203 Hypermetropia, bilateral: Secondary | ICD-10-CM | POA: Diagnosis not present

## 2022-05-02 DIAGNOSIS — Z23 Encounter for immunization: Secondary | ICD-10-CM | POA: Diagnosis not present

## 2022-05-07 DIAGNOSIS — Z23 Encounter for immunization: Secondary | ICD-10-CM | POA: Diagnosis not present

## 2022-06-16 DIAGNOSIS — Z85820 Personal history of malignant melanoma of skin: Secondary | ICD-10-CM | POA: Diagnosis not present

## 2022-06-16 DIAGNOSIS — L570 Actinic keratosis: Secondary | ICD-10-CM | POA: Diagnosis not present

## 2022-06-16 DIAGNOSIS — D492 Neoplasm of unspecified behavior of bone, soft tissue, and skin: Secondary | ICD-10-CM | POA: Diagnosis not present

## 2022-06-16 DIAGNOSIS — L814 Other melanin hyperpigmentation: Secondary | ICD-10-CM | POA: Diagnosis not present

## 2022-06-16 DIAGNOSIS — Z08 Encounter for follow-up examination after completed treatment for malignant neoplasm: Secondary | ICD-10-CM | POA: Diagnosis not present

## 2022-06-16 DIAGNOSIS — L821 Other seborrheic keratosis: Secondary | ICD-10-CM | POA: Diagnosis not present

## 2022-06-16 DIAGNOSIS — D225 Melanocytic nevi of trunk: Secondary | ICD-10-CM | POA: Diagnosis not present

## 2022-06-16 DIAGNOSIS — L538 Other specified erythematous conditions: Secondary | ICD-10-CM | POA: Diagnosis not present

## 2022-07-30 DIAGNOSIS — H43811 Vitreous degeneration, right eye: Secondary | ICD-10-CM | POA: Diagnosis not present

## 2022-07-30 DIAGNOSIS — H4423 Degenerative myopia, bilateral: Secondary | ICD-10-CM | POA: Diagnosis not present

## 2022-07-30 DIAGNOSIS — H40023 Open angle with borderline findings, high risk, bilateral: Secondary | ICD-10-CM | POA: Diagnosis not present

## 2022-07-30 DIAGNOSIS — Z961 Presence of intraocular lens: Secondary | ICD-10-CM | POA: Diagnosis not present

## 2022-07-30 DIAGNOSIS — H52223 Regular astigmatism, bilateral: Secondary | ICD-10-CM | POA: Diagnosis not present

## 2022-07-30 DIAGNOSIS — H524 Presbyopia: Secondary | ICD-10-CM | POA: Diagnosis not present

## 2022-07-30 DIAGNOSIS — H26491 Other secondary cataract, right eye: Secondary | ICD-10-CM | POA: Diagnosis not present

## 2022-07-30 DIAGNOSIS — H04123 Dry eye syndrome of bilateral lacrimal glands: Secondary | ICD-10-CM | POA: Diagnosis not present

## 2022-07-30 DIAGNOSIS — H5203 Hypermetropia, bilateral: Secondary | ICD-10-CM | POA: Diagnosis not present

## 2022-10-09 DIAGNOSIS — E559 Vitamin D deficiency, unspecified: Secondary | ICD-10-CM | POA: Diagnosis not present

## 2022-10-09 DIAGNOSIS — Z0000 Encounter for general adult medical examination without abnormal findings: Secondary | ICD-10-CM | POA: Diagnosis not present

## 2022-10-09 LAB — LAB REPORT - SCANNED: EGFR: 73

## 2022-10-14 DIAGNOSIS — E559 Vitamin D deficiency, unspecified: Secondary | ICD-10-CM | POA: Diagnosis not present

## 2022-10-14 DIAGNOSIS — I10 Essential (primary) hypertension: Secondary | ICD-10-CM | POA: Diagnosis not present

## 2022-10-14 DIAGNOSIS — E786 Lipoprotein deficiency: Secondary | ICD-10-CM | POA: Diagnosis not present

## 2022-10-14 DIAGNOSIS — I2510 Atherosclerotic heart disease of native coronary artery without angina pectoris: Secondary | ICD-10-CM | POA: Diagnosis not present

## 2022-10-14 DIAGNOSIS — Z86718 Personal history of other venous thrombosis and embolism: Secondary | ICD-10-CM | POA: Diagnosis not present

## 2022-10-14 DIAGNOSIS — M810 Age-related osteoporosis without current pathological fracture: Secondary | ICD-10-CM | POA: Diagnosis not present

## 2022-10-14 DIAGNOSIS — Z0000 Encounter for general adult medical examination without abnormal findings: Secondary | ICD-10-CM | POA: Diagnosis not present

## 2022-11-02 DIAGNOSIS — H52223 Regular astigmatism, bilateral: Secondary | ICD-10-CM | POA: Diagnosis not present

## 2022-11-02 DIAGNOSIS — H5203 Hypermetropia, bilateral: Secondary | ICD-10-CM | POA: Diagnosis not present

## 2022-11-02 DIAGNOSIS — H26491 Other secondary cataract, right eye: Secondary | ICD-10-CM | POA: Diagnosis not present

## 2022-11-02 DIAGNOSIS — H40023 Open angle with borderline findings, high risk, bilateral: Secondary | ICD-10-CM | POA: Diagnosis not present

## 2022-11-02 DIAGNOSIS — H4423 Degenerative myopia, bilateral: Secondary | ICD-10-CM | POA: Diagnosis not present

## 2022-11-02 DIAGNOSIS — H43811 Vitreous degeneration, right eye: Secondary | ICD-10-CM | POA: Diagnosis not present

## 2022-11-02 DIAGNOSIS — H04123 Dry eye syndrome of bilateral lacrimal glands: Secondary | ICD-10-CM | POA: Diagnosis not present

## 2022-11-02 DIAGNOSIS — Z961 Presence of intraocular lens: Secondary | ICD-10-CM | POA: Diagnosis not present

## 2022-11-02 DIAGNOSIS — H524 Presbyopia: Secondary | ICD-10-CM | POA: Diagnosis not present

## 2022-11-26 DIAGNOSIS — H903 Sensorineural hearing loss, bilateral: Secondary | ICD-10-CM | POA: Diagnosis not present

## 2022-11-26 DIAGNOSIS — H838X3 Other specified diseases of inner ear, bilateral: Secondary | ICD-10-CM | POA: Diagnosis not present

## 2022-11-26 DIAGNOSIS — H6123 Impacted cerumen, bilateral: Secondary | ICD-10-CM | POA: Diagnosis not present

## 2022-11-27 DIAGNOSIS — M810 Age-related osteoporosis without current pathological fracture: Secondary | ICD-10-CM | POA: Diagnosis not present

## 2022-11-30 DIAGNOSIS — H04123 Dry eye syndrome of bilateral lacrimal glands: Secondary | ICD-10-CM | POA: Diagnosis not present

## 2022-11-30 DIAGNOSIS — H401131 Primary open-angle glaucoma, bilateral, mild stage: Secondary | ICD-10-CM | POA: Diagnosis not present

## 2022-11-30 DIAGNOSIS — H26491 Other secondary cataract, right eye: Secondary | ICD-10-CM | POA: Diagnosis not present

## 2022-11-30 DIAGNOSIS — Z961 Presence of intraocular lens: Secondary | ICD-10-CM | POA: Diagnosis not present

## 2022-11-30 DIAGNOSIS — H43811 Vitreous degeneration, right eye: Secondary | ICD-10-CM | POA: Diagnosis not present

## 2022-12-08 ENCOUNTER — Ambulatory Visit (INDEPENDENT_AMBULATORY_CARE_PROVIDER_SITE_OTHER): Payer: Medicare Other | Admitting: Family

## 2022-12-08 ENCOUNTER — Encounter (HOSPITAL_BASED_OUTPATIENT_CLINIC_OR_DEPARTMENT_OTHER): Payer: Self-pay | Admitting: Family

## 2022-12-08 VITALS — BP 128/86 | HR 61 | Ht 68.0 in | Wt 178.0 lb

## 2022-12-08 DIAGNOSIS — I491 Atrial premature depolarization: Secondary | ICD-10-CM | POA: Diagnosis not present

## 2022-12-08 DIAGNOSIS — I10 Essential (primary) hypertension: Secondary | ICD-10-CM | POA: Diagnosis not present

## 2022-12-08 DIAGNOSIS — E785 Hyperlipidemia, unspecified: Secondary | ICD-10-CM | POA: Diagnosis not present

## 2022-12-08 DIAGNOSIS — I493 Ventricular premature depolarization: Secondary | ICD-10-CM | POA: Diagnosis not present

## 2022-12-08 DIAGNOSIS — Z86718 Personal history of other venous thrombosis and embolism: Secondary | ICD-10-CM | POA: Diagnosis not present

## 2022-12-08 DIAGNOSIS — I25118 Atherosclerotic heart disease of native coronary artery with other forms of angina pectoris: Secondary | ICD-10-CM | POA: Diagnosis not present

## 2022-12-08 NOTE — Patient Instructions (Addendum)
Medication Instructions:  Continue your current medications.   *If you need a refill on your cardiac medications before your next appointment, please call your pharmacy*  Lab Work: Your lab work in March with primary care looked great!  Testing/Procedures: Your EKG today showed normal sinus rhythm with an occasional early beat called a PVC or PAC. These are not dangerous and not of concerned. Your Metoprolol will help prevent this from happening as often.   Follow-Up: At [facility], you and your health needs are our priority.  As part of our continuing mission to provide you with exceptional heart care, we have created designated Provider Care Teams.  These Care Teams include your primary Cardiologist (physician) and Advanced Practice Providers (APPs -  Physician Assistants and Nurse Practitioners) who all work together to provide you with the care you need, when you need it.  We recommend signing up for the patient portal called "MyChart".  Sign up information is provided on this After Visit Summary.  MyChart is used to connect with patients for Virtual Visits (Telemedicine).  Patients are able to view lab/test results, encounter notes, upcoming appointments, etc.  Non-urgent messages can be sent to your provider as well.   To learn more about what you can do with MyChart, go to [email or web address].    Your next appointment:   1 year(s)  Provider:   K. [place] Hilty, MD or Gillian Shields, NP    Other Instructions  If you want to participate in the PREP exercise program just let us know and we will place a referral.

## 2022-12-08 NOTE — Progress Notes (Signed)
Office Visit    Patient Name: Katie Collins Date of Encounter: 12/08/2022  PCP:  Fatima Sanger, FNP   [facility]  Cardiologist:  Chrystie Nose, MD  Advanced Practice Provider:  No care team member to display Electrophysiologist:  None      Chief Complaint    Katie Collins is a 77 y.o. female presents today for overdue coronary artery disease follow-up  Past Medical History    Past Medical History:  Diagnosis Date   Anxiety    CAD (coronary artery disease)    Deep vein thrombosis (DVT) (HCC) 1987   REMOTE H/O; PT ALSO HAD 03/2012 A SOLEAL VEIN DVT LEFT LEG DUE TO FALL; WAS THEN STARTED ON XARELTO 04/22/12   Depression    Endometriosis    Fibroid    Hyperlipidemia    Hypertension    Melanoma (HCC)    Past Surgical History:  Procedure Laterality Date   ABDOMINAL HYSTERECTOMY  1987   TAH,BSO   BREAST EXCISIONAL BIOPSY Left 2013   benign   BREAST SURGERY  04/2011   mass in left breast, fibrosis   CARDIAC CATHETERIZATION  1990   PT HAD ANOTHER CATH 04/06/02; NORMAL-APPREARING CORONARY ARTERIES; NORMAL LV FUNCTION; NORMAL MITRAL AND AORTIC VALVES; NORMAL ABDOMINAL AORTA  AND RENAL ARTERIES   CAROTID DOPPLER  09/27/02   NORMAL COMMON AND INTERNAL CAROTID ARTERIES BILATERALLY W/NORMAL FLOW SEEN IN BOTH INTERNAL CAROTID ARTERIE. ; ANTEGRADE FLOW IN BOTH VERTEBRAL ARTERIES   JOINT REPLACEMENT  2005   Left rotator cuff replacement    LOWER EXTREMITY VENOUS DUPLEX  04/21/12   FINDINGS CONSISTENT W/ACUTE DVT INVOLVING THE LEFT SOLEAL VEIN; ALSO LEFT COMMON FEMORAL VEIN   MYOCARDIAL PERFUSION  03/27/02   NEGATIVE ADEQUATE BRUCE PROTOCOL W/DECONDITIONED EXERCISE RESPONSE, NORMAL STATIC AND DYNAMIC MYOCARDIAL PERFUSION IMAGES W/EF QGS 60%; LOW RSIK STUDY   PATELLA FRACTURE SURGERY Left 10/13    Allergies  No Known Allergies  History of Present Illness    Katie Collins is a 77 y.o. female with a hx of CAD (percutaneous intervention  s/p stent 1999, cath 2003 with no significant stenosis), DVT after fall, hysterectomy, HTN, HLD, PVC last seen 05/29/2021  She was seen 01/31/20 by Dr. Rennis Golden. Noted occasional LLE edema at previous soleal DVT site which improved with elevation and compression. She called the office 05/26/21 noting chest pain relieved with 1 aspirin. At follow-up office visit 05/29/2021 she was recommended for nuclear stress test due to episodes of chest pain.  Myoview 07/04/2021 low risk study with normal LVEF and no evidence of ischemia.  She presents today for follow up. Enjoys walking for exercise. 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 Her husband is having some memory difficulties which has been understandably frustrating.  Checking blood pressure at home intermittently 110s-120s/80s. Endorses following a heart healthy diet. Reports no shortness of breath nor dyspnea on exertion. Reports no chest pain, pressure, or tightness. No edema, orthopnea, PND. Reports no palpitations.    EKGs/Labs/Other Studies Reviewed:   The following studies were reviewed today:  Cardiac Studies & Procedures     STRESS TESTS  MYOCARDIAL PERFUSION IMAGING 07/04/2021  Narrative   Findings are consistent with no prior ischemia. The study is low risk.   No ST deviation was noted.   LV perfusion is abnormal.  There is a small defect with mild reduction in uptake present in the mid to basal inferior location(s). The defect  appears worse at rest than with stress consistent with artifact.   Left ventricular function is normal. Nuclear stress EF: 56 %. The left ventricular ejection fraction is normal (55-65%). End diastolic cavity size is normal.   Prior study available for comparison from 08/03/2017. Compared to prior study, the EF is normal on current study (was previously reported at 52%).              EKG:  EKG is  ordered today.  The ekg ordered today demonstrates NSR 61 bpm with occasional PVC  and PAC.   Recent Labs: No results found for requested labs within last 365 days.  Recent Lipid Panel No results found for: "CHOL", "TRIG", "HDL", "CHOLHDL", "VLDL", "LDLCALC", "LDLDIRECT"   Home Medications   Current Meds  Medication Sig   acyclovir (ZOVIRAX) 800 MG tablet as needed.   aspirin 325 MG EC tablet Take 325 mg by mouth daily.     atorvastatin (LIPITOR) 40 MG tablet Take 40 mg by mouth daily.     CALCIUM PO Take by mouth daily.   chlorthalidone (HYGROTON) 25 MG tablet Take 1 tablet (25 mg total) by mouth daily. NEED OV.   Cholecalciferol (VITAMIN D PO) Take 10,000 Int'l Units by mouth once a week.   escitalopram (LEXAPRO) 10 MG tablet TAKE 1 TABLET BY MOUTH EVERY DAY   ezetimibe (ZETIA) 10 MG tablet Take 10 mg by mouth daily.     irbesartan (AVAPRO) 300 MG tablet Take 1 tablet by mouth daily.   metoprolol succinate (TOPROL-XL) 25 MG 24 hr tablet Take 0.5 tablets (12.5 mg total) by mouth daily.   Multiple Vitamins-Minerals (MULTIVITAMIN PO) Take by mouth daily.   nitroGLYCERIN (NITROSTAT) 0.4 MG SL tablet Place 1 tablet (0.4 mg total) under the tongue every 5 (five) minutes as needed for chest pain.     Review of Systems      All other systems reviewed and are otherwise negative except as noted above.  Physical Exam    VS:  BP 128/86   Pulse 61   Ht 5\' 8"  (1.727 m)   Wt 178 lb (80.7 kg)   LMP 07/20/1985   BMI 27.06 kg/m  , BMI Body mass index is 27.06 kg/m.  Wt Readings from Last 3 Encounters:  12/08/22 178 lb (80.7 kg)  07/04/21 175 lb (79.4 kg)  05/29/21 175 lb 6.4 oz (79.6 kg)    GEN: Well nourished, well developed, in no acute distress. HEENT: normal. Neck: Supple, no JVD, carotid bruits, or masses. Cardiac: RRR, no murmurs, rubs, or gallops. No clubbing, cyanosis, edema.  Radials/PT 2+ and equal bilaterally.  Respiratory:  Respirations regular and unlabored, clear to auscultation bilaterally. GI: Soft, nontender, nondistended. MS: No deformity or  atrophy. Skin: Warm and dry, no rash. Neuro:  Strength and sensation are intact. Psych: Normal affect.  Assessment & Plan    CAD s/p balloon angioplasty 1999 - Low risk Myoview 07/2017 with LVEF 52%.  Low risk Myoview 06/2021 with no ischemia.  GDMT  Aspirin, Metoprolol, Atorvastatin, Zetia.  Heart healthy diet and regular cardiovascular exercise encouraged.   Presently very active at home but discussed possible referral to prep program at the [facility] in the winter months when it is colder and unable to walk outside.  She will contact us if interested in referral.  History of DVT - Continue Aspirin 325mg  daily. Continue to follow with PCP.  No recurrent LE edema.   HTN - BP well controlled today. BP at home well  controlled. Continue current antihypertensive regimen: Chlorthalidone 24mg  QD, Irbesartan 300mg  QD. 09/2022 creatinine 0.79.  HLD, LDL goal <70 - 10/09/22 LDL 62, normal liver enzymes. Continue Zetia 10mg  QD, Atorvastatin 40mg  QD. Denies myalias.   Bradycardia / PVC / PAC -prior bradycardia asymptomatic.  EKG today NSR 61 bpm with occasional PVC and PAC.  She is unaware of PAC/PVC.  No heart failure symptoms.  No indication for heart monitor or echocardiogram at this time.  Continue Toprol 12.5 mg daily.  Disposition: Follow up in 1 year(s)  with Chrystie Nose, MD or APP.   Signed, Alver Sorrow, NP 12/08/2022, 9:20 AM [facility]

## 2022-12-15 DIAGNOSIS — Z08 Encounter for follow-up examination after completed treatment for malignant neoplasm: Secondary | ICD-10-CM | POA: Diagnosis not present

## 2022-12-15 DIAGNOSIS — D492 Neoplasm of unspecified behavior of bone, soft tissue, and skin: Secondary | ICD-10-CM | POA: Diagnosis not present

## 2022-12-15 DIAGNOSIS — Z85820 Personal history of malignant melanoma of skin: Secondary | ICD-10-CM | POA: Diagnosis not present

## 2022-12-15 DIAGNOSIS — L814 Other melanin hyperpigmentation: Secondary | ICD-10-CM | POA: Diagnosis not present

## 2022-12-15 DIAGNOSIS — L821 Other seborrheic keratosis: Secondary | ICD-10-CM | POA: Diagnosis not present

## 2022-12-15 DIAGNOSIS — D225 Melanocytic nevi of trunk: Secondary | ICD-10-CM | POA: Diagnosis not present

## 2022-12-15 DIAGNOSIS — B079 Viral wart, unspecified: Secondary | ICD-10-CM | POA: Diagnosis not present

## 2022-12-15 DIAGNOSIS — L448 Other specified papulosquamous disorders: Secondary | ICD-10-CM | POA: Diagnosis not present

## 2022-12-22 DIAGNOSIS — H26491 Other secondary cataract, right eye: Secondary | ICD-10-CM | POA: Diagnosis not present

## 2022-12-22 DIAGNOSIS — H04123 Dry eye syndrome of bilateral lacrimal glands: Secondary | ICD-10-CM | POA: Diagnosis not present

## 2022-12-22 DIAGNOSIS — H401131 Primary open-angle glaucoma, bilateral, mild stage: Secondary | ICD-10-CM | POA: Diagnosis not present

## 2022-12-22 DIAGNOSIS — Z961 Presence of intraocular lens: Secondary | ICD-10-CM | POA: Diagnosis not present

## 2022-12-22 DIAGNOSIS — H43811 Vitreous degeneration, right eye: Secondary | ICD-10-CM | POA: Diagnosis not present

## 2023-01-25 DIAGNOSIS — H401131 Primary open-angle glaucoma, bilateral, mild stage: Secondary | ICD-10-CM | POA: Diagnosis not present

## 2023-01-25 DIAGNOSIS — Z961 Presence of intraocular lens: Secondary | ICD-10-CM | POA: Diagnosis not present

## 2023-01-25 DIAGNOSIS — H43811 Vitreous degeneration, right eye: Secondary | ICD-10-CM | POA: Diagnosis not present

## 2023-01-25 DIAGNOSIS — H04123 Dry eye syndrome of bilateral lacrimal glands: Secondary | ICD-10-CM | POA: Diagnosis not present

## 2023-02-26 DIAGNOSIS — M7918 Myalgia, other site: Secondary | ICD-10-CM | POA: Diagnosis not present

## 2023-02-26 DIAGNOSIS — H43811 Vitreous degeneration, right eye: Secondary | ICD-10-CM | POA: Diagnosis not present

## 2023-02-26 DIAGNOSIS — H04123 Dry eye syndrome of bilateral lacrimal glands: Secondary | ICD-10-CM | POA: Diagnosis not present

## 2023-02-26 DIAGNOSIS — M25512 Pain in left shoulder: Secondary | ICD-10-CM | POA: Diagnosis not present

## 2023-02-26 DIAGNOSIS — H401131 Primary open-angle glaucoma, bilateral, mild stage: Secondary | ICD-10-CM | POA: Diagnosis not present

## 2023-02-26 DIAGNOSIS — Z961 Presence of intraocular lens: Secondary | ICD-10-CM | POA: Diagnosis not present

## 2023-03-18 ENCOUNTER — Other Ambulatory Visit: Payer: Self-pay | Admitting: Registered Nurse

## 2023-03-18 DIAGNOSIS — Z0000 Encounter for general adult medical examination without abnormal findings: Secondary | ICD-10-CM

## 2023-03-31 DIAGNOSIS — Z23 Encounter for immunization: Secondary | ICD-10-CM | POA: Diagnosis not present

## 2023-03-31 DIAGNOSIS — Z1231 Encounter for screening mammogram for malignant neoplasm of breast: Secondary | ICD-10-CM

## 2023-04-12 ENCOUNTER — Ambulatory Visit
Admission: RE | Admit: 2023-04-12 | Discharge: 2023-04-12 | Disposition: A | Discharge status: Home / Self Care | Payer: Medicare Other | Source: Ambulatory Visit | Attending: Registered Nurse | Admitting: Registered Nurse

## 2023-04-12 DIAGNOSIS — Z0000 Encounter for general adult medical examination without abnormal findings: Secondary | ICD-10-CM

## 2023-04-12 DIAGNOSIS — Z1231 Encounter for screening mammogram for malignant neoplasm of breast: Secondary | ICD-10-CM | POA: Diagnosis not present

## 2023-05-14 DIAGNOSIS — Z23 Encounter for immunization: Secondary | ICD-10-CM | POA: Diagnosis not present

## 2023-05-26 DIAGNOSIS — H04123 Dry eye syndrome of bilateral lacrimal glands: Secondary | ICD-10-CM | POA: Diagnosis not present

## 2023-05-26 DIAGNOSIS — H401131 Primary open-angle glaucoma, bilateral, mild stage: Secondary | ICD-10-CM | POA: Diagnosis not present

## 2023-05-26 DIAGNOSIS — H43811 Vitreous degeneration, right eye: Secondary | ICD-10-CM | POA: Diagnosis not present

## 2023-05-26 DIAGNOSIS — Z961 Presence of intraocular lens: Secondary | ICD-10-CM | POA: Diagnosis not present

## 2023-06-02 DIAGNOSIS — M810 Age-related osteoporosis without current pathological fracture: Secondary | ICD-10-CM | POA: Diagnosis not present

## 2023-08-23 DIAGNOSIS — H401131 Primary open-angle glaucoma, bilateral, mild stage: Secondary | ICD-10-CM | POA: Diagnosis not present

## 2023-08-23 DIAGNOSIS — H43811 Vitreous degeneration, right eye: Secondary | ICD-10-CM | POA: Diagnosis not present

## 2023-08-23 DIAGNOSIS — H04123 Dry eye syndrome of bilateral lacrimal glands: Secondary | ICD-10-CM | POA: Diagnosis not present

## 2023-08-23 DIAGNOSIS — Z961 Presence of intraocular lens: Secondary | ICD-10-CM | POA: Diagnosis not present

## 2023-09-20 DIAGNOSIS — L570 Actinic keratosis: Secondary | ICD-10-CM | POA: Diagnosis not present

## 2023-09-20 DIAGNOSIS — Z08 Encounter for follow-up examination after completed treatment for malignant neoplasm: Secondary | ICD-10-CM | POA: Diagnosis not present

## 2023-09-20 DIAGNOSIS — L821 Other seborrheic keratosis: Secondary | ICD-10-CM | POA: Diagnosis not present

## 2023-09-20 DIAGNOSIS — Z85820 Personal history of malignant melanoma of skin: Secondary | ICD-10-CM | POA: Diagnosis not present

## 2023-09-20 DIAGNOSIS — D225 Melanocytic nevi of trunk: Secondary | ICD-10-CM | POA: Diagnosis not present

## 2023-09-20 DIAGNOSIS — L814 Other melanin hyperpigmentation: Secondary | ICD-10-CM | POA: Diagnosis not present

## 2023-10-12 DIAGNOSIS — E7800 Pure hypercholesterolemia, unspecified: Secondary | ICD-10-CM | POA: Diagnosis not present

## 2023-10-12 DIAGNOSIS — I10 Essential (primary) hypertension: Secondary | ICD-10-CM | POA: Diagnosis not present

## 2023-10-12 DIAGNOSIS — Z0000 Encounter for general adult medical examination without abnormal findings: Secondary | ICD-10-CM | POA: Diagnosis not present

## 2023-10-12 DIAGNOSIS — E559 Vitamin D deficiency, unspecified: Secondary | ICD-10-CM | POA: Diagnosis not present

## 2023-10-12 LAB — LAB REPORT - SCANNED: EGFR: 87

## 2023-10-19 DIAGNOSIS — I10 Essential (primary) hypertension: Secondary | ICD-10-CM | POA: Diagnosis not present

## 2023-10-19 DIAGNOSIS — Z0000 Encounter for general adult medical examination without abnormal findings: Secondary | ICD-10-CM | POA: Diagnosis not present

## 2023-10-19 DIAGNOSIS — E7800 Pure hypercholesterolemia, unspecified: Secondary | ICD-10-CM | POA: Diagnosis not present

## 2023-10-19 DIAGNOSIS — I2510 Atherosclerotic heart disease of native coronary artery without angina pectoris: Secondary | ICD-10-CM | POA: Diagnosis not present

## 2023-10-19 DIAGNOSIS — E559 Vitamin D deficiency, unspecified: Secondary | ICD-10-CM | POA: Diagnosis not present

## 2023-10-19 DIAGNOSIS — M810 Age-related osteoporosis without current pathological fracture: Secondary | ICD-10-CM | POA: Diagnosis not present

## 2023-11-24 ENCOUNTER — Telehealth (INDEPENDENT_AMBULATORY_CARE_PROVIDER_SITE_OTHER): Payer: Self-pay | Admitting: Otolaryngology

## 2023-11-24 NOTE — Telephone Encounter (Signed)
 Left vm to confirm location for appt on 11/25/2023.

## 2023-11-25 ENCOUNTER — Ambulatory Visit (INDEPENDENT_AMBULATORY_CARE_PROVIDER_SITE_OTHER): Payer: Medicare Other | Admitting: Otolaryngology

## 2023-11-25 ENCOUNTER — Encounter (INDEPENDENT_AMBULATORY_CARE_PROVIDER_SITE_OTHER): Payer: Self-pay | Admitting: Otolaryngology

## 2023-11-25 VITALS — BP 134/76 | HR 55 | Ht 68.0 in | Wt 170.0 lb

## 2023-11-25 DIAGNOSIS — H6123 Impacted cerumen, bilateral: Secondary | ICD-10-CM | POA: Diagnosis not present

## 2023-11-25 DIAGNOSIS — H903 Sensorineural hearing loss, bilateral: Secondary | ICD-10-CM | POA: Diagnosis not present

## 2023-11-27 DIAGNOSIS — H6123 Impacted cerumen, bilateral: Secondary | ICD-10-CM | POA: Insufficient documentation

## 2023-11-27 DIAGNOSIS — H903 Sensorineural hearing loss, bilateral: Secondary | ICD-10-CM | POA: Insufficient documentation

## 2023-11-27 NOTE — Progress Notes (Signed)
 Patient ID: Katie Collins, female   DOB: 1945-09-23, 78 y.o.   MRN: [id number]  Follow-up: Hearing loss  HPI: The patient is a 78 year old female who returns today for her follow-up evaluation.  The patient was last seen in May 2024.  At that time, she was complaining of bilateral progressive hearing loss.  She was also noted to have bilateral cerumen impaction and bilateral high-frequency sensorineural hearing loss.  She was treated with cerumen disimpaction.  The patient returns today reporting no significant change in her hearing.  However, she has noted increasing clogging sensation in her ears.  She denies any significant otalgia, otorrhea, or vertigo.  Exam: General: Communicates without difficulty, well nourished, no acute distress. Head: Normocephalic, no evidence injury, no tenderness, facial buttresses intact without stepoff. Face/sinus: No tenderness to palpation and percussion. Facial movement is normal and symmetric. Eyes: PERRL, EOMI. No scleral icterus, conjunctivae clear. Neuro: CN II exam reveals vision grossly intact.  No nystagmus at any point of gaze. Ears: Auricles well formed without lesions.  Bilateral cerumen impaction.  Nose: External evaluation reveals normal support and skin without lesions.  Dorsum is intact.  Anterior rhinoscopy reveals congested mucosa over anterior aspect of inferior turbinates and intact septum.  No purulence noted. Oral:  Oral cavity and oropharynx are intact, symmetric, without erythema or edema.  Mucosa is moist without lesions. Neck: Full range of motion without pain.  There is no significant lymphadenopathy.  No masses palpable.  Thyroid  bed within normal limits to palpation.  Parotid glands and submandibular glands equal bilaterally without mass.  Trachea is midline. Neuro:  CN 2-12 grossly intact.   Procedure: Bilateral cerumen disimpaction Anesthesia: None Description: Under the operating microscope, the cerumen is carefully removed with a  combination of cerumen currette, alligator forceps, and suction catheters.  After the cerumen is removed, the TMs are noted to be normal.  Bilateral stenotic ear canals.  No mass, erythema, or lesions. The patient tolerated the procedure well.    Assessment: 1.  Bilateral cerumen impaction.  After the disimpaction procedure, both tympanic membranes and middle ear spaces are noted to be normal. 2.  The patient has bilateral stenotic ear canals. 3.  Subjectively stable bilateral high-frequency sensorineural hearing loss.  Plan: 1.  Otomicroscopy with bilateral cerumen disimpaction. 2.  The physical exam findings are reviewed with the patient. 3.  The patient is a candidate for hearing amplification. 4.  The patient will return for reevaluation in 1 year.

## 2023-12-01 DIAGNOSIS — M810 Age-related osteoporosis without current pathological fracture: Secondary | ICD-10-CM | POA: Diagnosis not present

## 2023-12-15 ENCOUNTER — Ambulatory Visit (HOSPITAL_BASED_OUTPATIENT_CLINIC_OR_DEPARTMENT_OTHER): Payer: Medicare Other | Admitting: Internal Medicine

## 2023-12-15 VITALS — BP 126/68 | HR 63 | Ht 68.0 in | Wt 175.4 lb

## 2023-12-15 DIAGNOSIS — I25118 Atherosclerotic heart disease of native coronary artery with other forms of angina pectoris: Secondary | ICD-10-CM

## 2023-12-15 DIAGNOSIS — I491 Atrial premature depolarization: Secondary | ICD-10-CM | POA: Diagnosis not present

## 2023-12-15 DIAGNOSIS — E785 Hyperlipidemia, unspecified: Secondary | ICD-10-CM | POA: Diagnosis not present

## 2023-12-15 DIAGNOSIS — I10 Essential (primary) hypertension: Secondary | ICD-10-CM | POA: Diagnosis not present

## 2023-12-15 NOTE — Patient Instructions (Signed)
 Medication Instructions:  Your physician recommends that you continue on your current medications as directed. Please refer to the Current Medication list given to you today.  *If you need a refill on your cardiac medications before your next appointment, please call your pharmacy*  Follow-Up: At [facility], you and your health needs are our priority.  As part of our continuing mission to provide you with exceptional heart care, our providers are all part of one team.  This team includes your primary Cardiologist (physician) and Advanced Practice Providers or APPs (Physician Assistants and Nurse Practitioners) who all work together to provide you with the care you need, when you need it.  Your next appointment:   In 1 year with Dr. Maximo Spar or Neomi Banks NP
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
## 2024-02-14 DIAGNOSIS — H401131 Primary open-angle glaucoma, bilateral, mild stage: Secondary | ICD-10-CM | POA: Diagnosis not present

## 2024-02-14 DIAGNOSIS — Z961 Presence of intraocular lens: Secondary | ICD-10-CM | POA: Diagnosis not present

## 2024-02-14 DIAGNOSIS — H524 Presbyopia: Secondary | ICD-10-CM | POA: Diagnosis not present

## 2024-02-14 DIAGNOSIS — H04123 Dry eye syndrome of bilateral lacrimal glands: Secondary | ICD-10-CM | POA: Diagnosis not present

## 2024-02-14 DIAGNOSIS — H52223 Regular astigmatism, bilateral: Secondary | ICD-10-CM | POA: Diagnosis not present

## 2024-02-14 DIAGNOSIS — H43811 Vitreous degeneration, right eye: Secondary | ICD-10-CM | POA: Diagnosis not present

## 2024-02-14 DIAGNOSIS — H5203 Hypermetropia, bilateral: Secondary | ICD-10-CM | POA: Diagnosis not present

## 2024-04-25 DIAGNOSIS — E7800 Pure hypercholesterolemia, unspecified: Secondary | ICD-10-CM | POA: Diagnosis not present

## 2024-04-25 DIAGNOSIS — M810 Age-related osteoporosis without current pathological fracture: Secondary | ICD-10-CM | POA: Diagnosis not present

## 2024-05-09 DIAGNOSIS — Z23 Encounter for immunization: Secondary | ICD-10-CM | POA: Diagnosis not present

## 2024-06-05 DIAGNOSIS — M810 Age-related osteoporosis without current pathological fracture: Secondary | ICD-10-CM | POA: Diagnosis not present
